# Patient Record
Sex: Female | Born: 1965 | Race: Black or African American | Hispanic: No | Marital: Single | Smoking: Never smoker
Health system: Southern US, Community
[De-identification: ages and names within clinical notes are randomized; demographics above are authoritative.]

## PROBLEM LIST (undated history)

## (undated) DIAGNOSIS — I471 Supraventricular tachycardia: Secondary | ICD-10-CM

## (undated) DIAGNOSIS — I4719 Other supraventricular tachycardia: Secondary | ICD-10-CM

## (undated) DIAGNOSIS — M069 Rheumatoid arthritis, unspecified: Secondary | ICD-10-CM

## (undated) DIAGNOSIS — J45909 Unspecified asthma, uncomplicated: Secondary | ICD-10-CM

## (undated) DIAGNOSIS — T7840XA Allergy, unspecified, initial encounter: Secondary | ICD-10-CM

## (undated) DIAGNOSIS — D219 Benign neoplasm of connective and other soft tissue, unspecified: Secondary | ICD-10-CM

## (undated) HISTORY — DX: Rheumatoid arthritis, unspecified: M06.9

## (undated) HISTORY — DX: Allergy, unspecified, initial encounter: T78.40XA

## (undated) HISTORY — DX: Unspecified asthma, uncomplicated: J45.909

## (undated) HISTORY — PX: SMALL INTESTINE SURGERY: SHX150

---

## 1999-10-17 ENCOUNTER — Encounter: Payer: Self-pay | Admitting: Emergency Medicine

## 1999-10-17 ENCOUNTER — Emergency Department (HOSPITAL_COMMUNITY): Admission: EM | Admit: 1999-10-17 | Discharge: 1999-10-17 | Payer: Self-pay | Admitting: Emergency Medicine

## 2000-10-23 ENCOUNTER — Emergency Department (HOSPITAL_COMMUNITY): Admission: EM | Admit: 2000-10-23 | Discharge: 2000-10-23 | Payer: Self-pay | Admitting: Emergency Medicine

## 2000-10-23 ENCOUNTER — Encounter: Payer: Self-pay | Admitting: Emergency Medicine

## 2001-11-22 ENCOUNTER — Other Ambulatory Visit: Admission: RE | Admit: 2001-11-22 | Discharge: 2001-11-22 | Payer: Self-pay | Admitting: Obstetrics and Gynecology

## 2005-03-08 ENCOUNTER — Emergency Department (HOSPITAL_COMMUNITY): Admission: EM | Admit: 2005-03-08 | Discharge: 2005-03-08 | Payer: Self-pay | Admitting: *Deleted

## 2009-04-22 ENCOUNTER — Emergency Department (HOSPITAL_COMMUNITY): Admission: EM | Admit: 2009-04-22 | Discharge: 2009-04-22 | Payer: Self-pay | Admitting: Emergency Medicine

## 2009-04-22 ENCOUNTER — Emergency Department (HOSPITAL_COMMUNITY): Admission: EM | Admit: 2009-04-22 | Discharge: 2009-04-23 | Payer: Self-pay | Admitting: Emergency Medicine

## 2009-06-06 ENCOUNTER — Encounter (INDEPENDENT_AMBULATORY_CARE_PROVIDER_SITE_OTHER): Payer: Self-pay

## 2009-06-06 ENCOUNTER — Inpatient Hospital Stay (HOSPITAL_COMMUNITY): Admission: EM | Admit: 2009-06-06 | Discharge: 2009-06-13 | Payer: Self-pay | Admitting: Emergency Medicine

## 2010-06-15 LAB — DIFFERENTIAL
Eosinophils Absolute: 0 10*3/uL (ref 0.0–0.7)
Eosinophils Relative: 0 % (ref 0–5)
Monocytes Relative: 13 % — ABNORMAL HIGH (ref 3–12)
Neutro Abs: 4.2 10*3/uL (ref 1.7–7.7)
Neutrophils Relative %: 62 % (ref 43–77)

## 2010-06-15 LAB — POCT URINALYSIS DIP (DEVICE)
Hgb urine dipstick: NEGATIVE
Protein, ur: NEGATIVE mg/dL
Specific Gravity, Urine: 1.025 (ref 1.005–1.030)
Urobilinogen, UA: 0.2 mg/dL (ref 0.0–1.0)
pH: 6 (ref 5.0–8.0)

## 2010-06-15 LAB — POCT I-STAT, CHEM 8
Calcium, Ion: 1.07 mmol/L — ABNORMAL LOW (ref 1.12–1.32)
Creatinine, Ser: 0.6 mg/dL (ref 0.4–1.2)
Glucose, Bld: 117 mg/dL — ABNORMAL HIGH (ref 70–99)
Hemoglobin: 14.3 g/dL (ref 12.0–15.0)

## 2010-06-15 LAB — CBC
HCT: 31.5 % — ABNORMAL LOW (ref 36.0–46.0)
MCV: 80 fL (ref 78.0–100.0)
RBC: 3.94 MIL/uL (ref 3.87–5.11)

## 2010-06-15 LAB — URINE MICROSCOPIC-ADD ON

## 2010-06-15 LAB — URINALYSIS, ROUTINE W REFLEX MICROSCOPIC
Glucose, UA: 100 mg/dL — AB
Leukocytes, UA: NEGATIVE
Specific Gravity, Urine: 1.018 (ref 1.005–1.030)
pH: 8.5 — ABNORMAL HIGH (ref 5.0–8.0)

## 2010-06-15 LAB — BASIC METABOLIC PANEL
CO2: 24 mEq/L (ref 19–32)
Creatinine, Ser: 0.56 mg/dL (ref 0.4–1.2)
GFR calc Af Amer: >60 mL/min (ref >60)
Glucose, Bld: 100 mg/dL — ABNORMAL HIGH (ref 70–99)
Potassium: 3.6 mEq/L (ref 3.5–5.1)

## 2010-06-22 LAB — COMPREHENSIVE METABOLIC PANEL WITH GFR
AST: 28 U/L (ref 0–37)
Albumin: 3.3 g/dL — ABNORMAL LOW (ref 3.5–5.2)
BUN: 7 mg/dL (ref 6–23)
Calcium: 9.4 mg/dL (ref 8.4–10.5)
Creatinine, Ser: 0.59 mg/dL (ref 0.4–1.2)
Potassium: 4.2 meq/L (ref 3.5–5.1)
Total Protein: 8.5 g/dL — ABNORMAL HIGH (ref 6.0–8.3)

## 2010-06-22 LAB — DIFFERENTIAL
Basophils Absolute: 0 10*3/uL (ref 0.0–0.1)
Basophils Absolute: 0.1 K/uL (ref 0.0–0.1)
Basophils Relative: 0 % (ref 0–1)
Basophils Relative: 1 % (ref 0–1)
Eosinophils Absolute: 0 10*3/uL (ref 0.0–0.7)
Eosinophils Absolute: 0 K/uL (ref 0.0–0.7)
Eosinophils Relative: 0 % (ref 0–5)
Eosinophils Relative: 0 % (ref 0–5)
Lymphocytes Relative: 7 % — ABNORMAL LOW (ref 12–46)
Lymphs Abs: 0.5 10*3/uL — ABNORMAL LOW (ref 0.7–4.0)
Lymphs Abs: 0.6 10*3/uL — ABNORMAL LOW (ref 0.7–4.0)
Lymphs Abs: 0.9 10*3/uL (ref 0.7–4.0)
Monocytes Absolute: 0.6 K/uL (ref 0.1–1.0)
Monocytes Relative: 12 % (ref 3–12)
Monocytes Relative: 8 % (ref 3–12)
Neutro Abs: 14.3 10*3/uL — ABNORMAL HIGH (ref 1.7–7.7)
Neutro Abs: 6.7 10*3/uL (ref 1.7–7.7)
Neutrophils Relative %: 84 % — ABNORMAL HIGH (ref 43–77)
Neutrophils Relative %: 89 % — ABNORMAL HIGH (ref 43–77)

## 2010-06-22 LAB — CBC
HCT: 29.5 % — ABNORMAL LOW (ref 36.0–46.0)
HCT: 30 % — ABNORMAL LOW (ref 36.0–46.0)
HCT: 37.8 % (ref 36.0–46.0)
Hemoglobin: 12.3 g/dL (ref 12.0–15.0)
Hemoglobin: 9.9 g/dL — ABNORMAL LOW (ref 12.0–15.0)
MCHC: 32.6 g/dL (ref 30.0–36.0)
MCHC: 32.8 g/dL (ref 30.0–36.0)
MCHC: 33 g/dL (ref 30.0–36.0)
MCV: 79.7 fL (ref 78.0–100.0)
MCV: 80.7 fL (ref 78.0–100.0)
Platelets: 229 10*3/uL (ref 150–400)
Platelets: 286 10*3/uL (ref 150–400)
RBC: 3.66 MIL/uL — ABNORMAL LOW (ref 3.87–5.11)
RBC: 3.7 MIL/uL — ABNORMAL LOW (ref 3.87–5.11)
RBC: 4.74 MIL/uL (ref 3.87–5.11)
RDW: 17.7 % — ABNORMAL HIGH (ref 11.5–15.5)
RDW: 17.8 % — ABNORMAL HIGH (ref 11.5–15.5)
RDW: 18 % — ABNORMAL HIGH (ref 11.5–15.5)
WBC: 7.9 10*3/uL (ref 4.0–10.5)
WBC: 8 K/uL (ref 4.0–10.5)

## 2010-06-22 LAB — URINALYSIS, ROUTINE W REFLEX MICROSCOPIC
Glucose, UA: NEGATIVE mg/dL
Hgb urine dipstick: NEGATIVE
Ketones, ur: >80 mg/dL — AB
Leukocytes, UA: NEGATIVE
Nitrite: NEGATIVE
Protein, ur: 100 mg/dL — AB
Specific Gravity, Urine: 1.038 — ABNORMAL HIGH (ref 1.005–1.030)
Urobilinogen, UA: 1 mg/dL (ref 0.0–1.0)
pH: 6 (ref 5.0–8.0)

## 2010-06-22 LAB — BASIC METABOLIC PANEL
BUN: 2 mg/dL — ABNORMAL LOW (ref 6–23)
CO2: 22 mEq/L (ref 19–32)
CO2: 27 mEq/L (ref 19–32)
Calcium: 8.7 mg/dL (ref 8.4–10.5)
Chloride: 106 mEq/L (ref 96–112)
Chloride: 108 mEq/L (ref 96–112)
GFR calc Af Amer: >60 mL/min (ref >60)
GFR calc Af Amer: >60 mL/min (ref >60)
Glucose, Bld: 143 mg/dL — ABNORMAL HIGH (ref 70–99)
Potassium: 3.8 mEq/L (ref 3.5–5.1)
Sodium: 138 mEq/L (ref 135–145)

## 2010-06-22 LAB — URINE MICROSCOPIC-ADD ON

## 2010-06-22 LAB — COMPREHENSIVE METABOLIC PANEL
ALT: 16 U/L (ref 0–35)
Alkaline Phosphatase: 89 U/L (ref 39–117)
CO2: 19 mEq/L (ref 19–32)
Chloride: 99 mEq/L (ref 96–112)
GFR calc Af Amer: >60 mL/min (ref >60)
GFR calc non Af Amer: >60 mL/min (ref >60)
Glucose, Bld: 114 mg/dL — ABNORMAL HIGH (ref 70–99)
Sodium: 132 mEq/L — ABNORMAL LOW (ref 135–145)
Total Bilirubin: 1.9 mg/dL — ABNORMAL HIGH (ref 0.3–1.2)

## 2010-06-22 LAB — PREGNANCY, URINE: Preg Test, Ur: NEGATIVE

## 2010-06-22 LAB — LIPASE, BLOOD: Lipase: 22 U/L (ref 11–59)

## 2012-04-30 ENCOUNTER — Encounter (HOSPITAL_COMMUNITY): Payer: Self-pay | Admitting: Emergency Medicine

## 2012-04-30 ENCOUNTER — Emergency Department (HOSPITAL_COMMUNITY): Payer: 59

## 2012-04-30 ENCOUNTER — Emergency Department (HOSPITAL_COMMUNITY)
Admission: EM | Admit: 2012-04-30 | Discharge: 2012-04-30 | Disposition: A | Discharge status: Home / Self Care | Payer: 59 | Attending: Emergency Medicine | Admitting: Emergency Medicine

## 2012-04-30 ENCOUNTER — Encounter (HOSPITAL_COMMUNITY): Payer: Self-pay | Admitting: Nurse Practitioner

## 2012-04-30 ENCOUNTER — Emergency Department (HOSPITAL_COMMUNITY)
Admission: EM | Admit: 2012-04-30 | Discharge: 2012-04-30 | Disposition: A | Discharge status: Nursing Home (Includes ICF) | Payer: 59 | Source: Home / Self Care

## 2012-04-30 DIAGNOSIS — R0602 Shortness of breath: Secondary | ICD-10-CM | POA: Insufficient documentation

## 2012-04-30 DIAGNOSIS — R000 Tachycardia, unspecified: Secondary | ICD-10-CM

## 2012-04-30 DIAGNOSIS — R42 Dizziness and giddiness: Secondary | ICD-10-CM | POA: Insufficient documentation

## 2012-04-30 DIAGNOSIS — R002 Palpitations: Secondary | ICD-10-CM | POA: Insufficient documentation

## 2012-04-30 DIAGNOSIS — Z8739 Personal history of other diseases of the musculoskeletal system and connective tissue: Secondary | ICD-10-CM | POA: Insufficient documentation

## 2012-04-30 DIAGNOSIS — J3489 Other specified disorders of nose and nasal sinuses: Secondary | ICD-10-CM | POA: Insufficient documentation

## 2012-04-30 LAB — POCT I-STAT, CHEM 8
BUN: 10 mg/dL (ref 6–23)
Calcium, Ion: 1.26 mmol/L — ABNORMAL HIGH (ref 1.12–1.23)
Chloride: 111 mEq/L (ref 96–112)
Creatinine, Ser: 0.7 mg/dL (ref 0.50–1.10)
Sodium: 140 mEq/L (ref 135–145)
TCO2: 22 mmol/L (ref 0–100)

## 2012-04-30 LAB — CBC
HCT: 31.4 % — ABNORMAL LOW (ref 36.0–46.0)
Hemoglobin: 9.8 g/dL — ABNORMAL LOW (ref 12.0–15.0)
MCH: 22.7 pg — ABNORMAL LOW (ref 26.0–34.0)
MCHC: 31.2 g/dL (ref 30.0–36.0)
RBC: 4.31 MIL/uL (ref 3.87–5.11)

## 2012-04-30 LAB — D-DIMER, QUANTITATIVE: D-Dimer, Quant: 0.33 ug/mL-FEU (ref 0.00–0.48)

## 2012-04-30 MED ORDER — DILTIAZEM HCL 50 MG/10ML IV SOLN
10.0000 mg | Freq: Once | INTRAVENOUS | Status: AC
  Administered 2012-04-30: 10 mg via INTRAVENOUS
  Filled 2012-04-30: qty 2

## 2012-04-30 MED ORDER — ASPIRIN 81 MG PO CHEW
324.0000 mg | CHEWABLE_TABLET | Freq: Once | ORAL | Status: AC
  Administered 2012-04-30: 324 mg via ORAL
  Filled 2012-04-30: qty 4

## 2012-04-30 MED ORDER — SODIUM CHLORIDE 0.9 % IV SOLN
Freq: Once | INTRAVENOUS | Status: AC
  Administered 2012-04-30: 14:00:00 via INTRAVENOUS

## 2012-04-30 NOTE — ED Provider Notes (Signed)
History     CSN: [id number]  Arrival date & time 04/30/12  1442   First MD Initiated Contact with Patient 04/30/12 1508      Chief Complaint  Patient presents with  . Tachycardia    (Consider location/radiation/quality/duration/timing/severity/associated sxs/prior treatment) The history is provided by the patient and medical records.    Alicia Fleming is a 47 y.o. female  with a hx of rheumatoid arthritis for which she has taken prednisone in the past but not within the last 6 months, presents to the Emergency Department complaining of gradual, persistent, progressively worsening tachycardia onset 11 AM. Patient states she got up this morning and noticed her heart was racing. She laid back down and her heart slowed but each time she subsequently stood up her heart would race again. She had associated shortness of breath, mild lightheadedness when standing and a "strange feeling in her chest."  Nothing makes it better and standing makes it worse.  Pt denies fever, chills, headache, neck pain, chest pain abdominal pain, nausea, vomiting, diarrhea, weakness, dizziness, syncope, dysuria, hematuria.  Patient is a nonsmoker, denies illicit drug use he endorses occasional social drinking.  Patient denies taking this estrogen, recent surgery, long car trips or immobilization.  Patient states she had one mixed drink last night with a mild headache this morning which was alleviated by Tylenol Extra Strength.  Patient also states there has been some significant family stress and she feels mildly anxious but has never had official panic attacks.  She's had mild rhinorrhea for the past week but she has not taken any decongestants or cold medications.   Past Medical History  Diagnosis Date  . Arthritis     History reviewed. No pertinent past surgical history.  History reviewed. No pertinent family history.  History  Substance Use Topics  . Smoking status: Never Smoker   . Smokeless tobacco: Not on  file  . Alcohol Use: No    OB History    Grav Para Term Preterm Abortions TAB SAB Ect Mult Living                  Review of Systems  Constitutional: Negative for fever, diaphoresis, appetite change, fatigue and unexpected weight change.  HENT: Positive for rhinorrhea. Negative for mouth sores, neck pain and neck stiffness.   Eyes: Negative for visual disturbance.  Respiratory: Negative for cough, chest tightness, shortness of breath and wheezing.   Cardiovascular: Positive for palpitations. Negative for chest pain and leg swelling.  Gastrointestinal: Negative for nausea, vomiting, abdominal pain, diarrhea and constipation.  Genitourinary: Negative for dysuria, urgency, frequency and hematuria.  Musculoskeletal: Negative for back pain.  Skin: Negative for rash.  Neurological: Negative for syncope, light-headedness and headaches.  Hematological: Does not bruise/bleed easily.  Psychiatric/Behavioral: Negative for sleep disturbance. The patient is not nervous/anxious.   All other systems reviewed and are negative.    Allergies  Review of patient's allergies indicates no known allergies.  Home Medications   Current Outpatient Rx  Name  Route  Sig  Dispense  Refill  . ACETAMINOPHEN 500 MG PO TABS   Oral   Take 500 mg by mouth every 6 (six) hours as needed. For pain         . CETIRIZINE HCL 10 MG PO TABS   Oral   Take 10 mg by mouth daily.           BP 98/68  Pulse 111  Temp 98.3 F (36.8 C) (Oral)  Resp 15  Ht 5\' 7"  (1.702 m)  Wt 152 lb (68.947 kg)  BMI 23.81 kg/m2  SpO2 100%  LMP 04/03/2012  Physical Exam  Nursing note and vitals reviewed. Constitutional: She is oriented to person, place, and time. She appears well-developed and well-nourished. No distress. She is not intubated.  HENT:  Head: Normocephalic and atraumatic.  Mouth/Throat: Uvula is midline, oropharynx is clear and moist and mucous membranes are normal. No oropharyngeal exudate, posterior  oropharyngeal edema, posterior oropharyngeal erythema or tonsillar abscesses.       No exophthalmos noted   Eyes: Conjunctivae normal and EOM are normal. Pupils are equal, round, and reactive to light. No scleral icterus.  Neck: Normal range of motion, full passive range of motion without pain and phonation normal. Neck supple. No mass and no thyromegaly present.  Cardiovascular: Regular rhythm, S1 normal, S2 normal, normal heart sounds and intact distal pulses.  Tachycardia present.  PMI is not displaced.   No murmur heard. Pulses:      Radial pulses are 2+ on the right side, and 2+ on the left side.       Dorsalis pedis pulses are 2+ on the right side, and 2+ on the left side.       Posterior tibial pulses are 2+ on the right side, and 2+ on the left side.  Pulmonary/Chest: Effort normal and breath sounds normal. No accessory muscle usage. Not tachypneic. She is not intubated. No respiratory distress. She has no decreased breath sounds. She has no wheezes. She has no rhonchi. She has no rales.  Abdominal: Soft. Normal appearance and bowel sounds are normal. She exhibits no mass. There is no tenderness. There is no rigidity, no rebound, no guarding, no CVA tenderness, no tenderness at McBurney's point and negative Murphy's sign.  Musculoskeletal: Normal range of motion. She exhibits no edema and no tenderness.       No peripheral edema; no pretibial myxodema   Lymphadenopathy:    She has no cervical adenopathy.  Neurological: She is alert and oriented to person, place, and time. She has normal reflexes. She exhibits normal muscle tone. Coordination normal.       Speech is clear and goal oriented Moves extremities without ataxia  Skin: Skin is warm and dry. No rash noted. She is not diaphoretic. No erythema.  Psychiatric: She has a normal mood and affect.    ED Course  Procedures (including critical care time)  Labs Reviewed  PRO B NATRIURETIC PEPTIDE - Abnormal; Notable for the  following:    Pro B Natriuretic peptide (BNP) 165.2 (*)     All other components within normal limits  CBC - Abnormal; Notable for the following:    Hemoglobin 9.8 (*)     HCT 31.4 (*)     MCV 72.9 (*)     MCH 22.7 (*)     RDW 17.2 (*)     All other components within normal limits  POCT I-STAT, CHEM 8 - Abnormal; Notable for the following:    Calcium, Ion 1.26 (*)     Hemoglobin 11.2 (*)     HCT 33.0 (*)     All other components within normal limits  D-DIMER, QUANTITATIVE   Dg Chest 2 View  04/30/2012  *RADIOLOGY REPORT*  Clinical Data: Tachycardia  CHEST - 2 VIEW  Comparison: None.  Findings:  Normal cardiac silhouette and mediastinal contours.  No focal parenchymal opacities. No definite evidence of pulmonary edema.  No pleural effusion or pneumothorax.  Mild scoliotic  curvature of the thoracolumbar spine, possibly positional.  IMPRESSION: No acute cardiopulmonary disease.   Original Report Authenticated By: Tacey Ruiz, MD     ECG:  Date: 04/30/2012  Rate: 142  Rhythm: junctional tachycardia  QRS Axis: normal  Intervals: n/a  ST/T Wave abnormalities: nonspecific T wave changes  Conduction Disutrbances:none  Narrative Interpretation: no p waves present, inverted T waves II, III, V3-V6  Old EKG Reviewed: none available    1. Tachycardia       MDM  Alicia Fleming presents for tachycardia.  SBP in the low 90's with resting HR in the 130's.  Concern for PE, anemia, thyroid conditions, hypovolemia and contributing anxiety.  Labs and imaging ordered.  Pt receiving fluid bolus and Cardizem 10mg  IV ordered.  Wells PE criteria is moderate, and pt is not PERC negative.    Mildly elevated BNP at 165, d-dimer negative, patient mildly anemic at 9.8 but this appears to be her baseline, i-STAT Chem-8 without slight abnormalities.  Pt BP has remained steady throughout time in the department.  She given Cardizem 10 mg IV with conversion to normal sinus rhythm at less than 100. Confirmed by  ECG.   Date: 04/30/2012  Rate: 92  Rhythm: normal sinus rhythm  QRS Axis: normal  Intervals: normal  ST/T Wave abnormalities: normal  Conduction Disutrbances:none  Narrative Interpretation: Normal sinus rhythm, nonischemic ECG  Old EKG Reviewed: changes noted  Patient with resolution of palpitations and shortness of breath after rhythm conversion. Likely episode of SVT vs POTS.  Dr. Rennis Chris discussed with [facility] cardiology who recommends DC home without medication and close followup next week.  I discussed these findings with the patient and her plan of action.  I have also discussed reasons to return immediately to the ER.  Patient expresses understanding and agrees with plan.  1. Medications: usual home medications 2. Treatment: rest, drink plenty of fluids,  3. Follow Up: Please followup with your primary doctor for discussion of your diagnoses and further evaluation after today's visit; followup with [facility] cardiology, call tomorrow morning to make an appointment for next week         [facility], PA-C 04/30/12 1741

## 2012-04-30 NOTE — ED Notes (Signed)
Pt states that for the past hour and a half her heart has been racing. She has laid down and rested and it slowed down but upon standing heart starts to race again. Pt is having slight shortness of breath. Pt denies chest pain and complaining of a slight head ache.

## 2012-04-30 NOTE — ED Notes (Signed)
Patient is not experiencing any lightheadedness or dizziness after doing the orthostatics.

## 2012-04-30 NOTE — ED Provider Notes (Deleted)
History     CSN: [id number]  Arrival date & time 04/30/12  1337   None     Chief Complaint  Patient presents with  . Tachycardia    heart racing x hour and half. pt tried rest. which helped but upon standing heart races again.     (Consider location/radiation/quality/duration/timing/severity/associated sxs/prior treatment) Patient is a 47 y.o. female presenting with palpitations. The history is provided by the patient.  Palpitations  This is a new problem. The problem occurs constantly. The problem has not changed since onset.The problem is associated with anxiety. Associated symptoms include shortness of breath. She has tried nothing for the symptoms. The treatment provided moderate relief.    Past Medical History  Diagnosis Date  . Arthritis     History reviewed. No pertinent past surgical history.  History reviewed. No pertinent family history.  History  Substance Use Topics  . Smoking status: Never Smoker   . Smokeless tobacco: Not on file  . Alcohol Use: No    OB History    Grav Para Term Preterm Abortions TAB SAB Ect Mult Living                  Review of Systems  Respiratory: Positive for shortness of breath.   Cardiovascular: Positive for palpitations.  All other systems reviewed and are negative.    Allergies  Review of patient's allergies indicates not on file.  Home Medications  No current outpatient prescriptions on file.  BP 103/68  Pulse 150  Temp 97.9 F (36.6 C) (Oral)  Resp 18  SpO2 100%  LMP 04/10/2012  Physical Exam  Nursing note and vitals reviewed. Constitutional: She is oriented to person, place, and time. She appears well-developed and well-nourished.  HENT:  Head: Normocephalic and atraumatic.  Right Ear: External ear normal.  Left Ear: External ear normal.  Eyes: Conjunctivae normal and EOM are normal. Pupils are equal, round, and reactive to light.  Neck: Normal range of motion. Neck supple.  Cardiovascular: Normal  rate.   Pulmonary/Chest: Effort normal and breath sounds normal.  Abdominal: Soft.  Musculoskeletal: Normal range of motion.  Neurological: She is alert and oriented to person, place, and time. She has normal reflexes.  Skin: Skin is warm.  Psychiatric: She has a normal mood and affect.    ED Course  Procedures (including critical care time)  Labs Reviewed - No data to display No results found.   1. Tachycardia       MDM   Date: 04/30/2012  Rate: 136  Rhythm: sinus tachycardia  QRS Axis: normal  Intervals: normal  ST/T Wave abnormalities: normal  Conduction Disutrbances:p wave abnormality  Narrative Interpretation:   Old EKG Reviewed: none available         ELESHIA WOOLEY, [place] 04/30/12 1431

## 2012-04-30 NOTE — ED Notes (Signed)
Sinus tach noted per monitor with rate 140's

## 2012-04-30 NOTE — ED Provider Notes (Signed)
History     CSN: [id number]  Arrival date & time 04/30/12  1337   None     Chief Complaint  Patient presents with  . Tachycardia    heart racing x hour and half. pt tried rest. which helped but upon standing heart races again.     (Consider location/radiation/quality/duration/timing/severity/associated sxs/prior treatment) Patient is a 47 y.o. female presenting with palpitations. The history is provided by the patient. No language interpreter was used.  Palpitations  This is a new problem. The current episode started 1 to 2 hours ago. The problem occurs constantly. The problem has been gradually worsening. The problem is associated with anxiety. On average, each episode lasts 2 hours. Associated symptoms include shortness of breath. Associated symptoms comments: Short of breath. She has tried nothing for the symptoms. The treatment provided no relief.  Pt complains of feeling short of breath and her heart racing.   Pt reports worse with exertion,  Decreased shortness of breath with exertion  Past Medical History  Diagnosis Date  . Arthritis     History reviewed. No pertinent past surgical history.  History reviewed. No pertinent family history.  History  Substance Use Topics  . Smoking status: Never Smoker   . Smokeless tobacco: Not on file  . Alcohol Use: No    OB History    Grav Para Term Preterm Abortions TAB SAB Ect Mult Living                  Review of Systems  Respiratory: Positive for shortness of breath.   Cardiovascular: Positive for palpitations.  All other systems reviewed and are negative.    Allergies  Review of patient's allergies indicates not on file.  Home Medications  No current outpatient prescriptions on file.  BP 107/68  Pulse 138  Temp 97.9 F (36.6 C) (Oral)  Resp 26  SpO2 100%  LMP 04/10/2012  Physical Exam  Nursing note and vitals reviewed. Constitutional: She appears well-developed and well-nourished.  HENT:  Head:  Normocephalic.  Right Ear: External ear normal.  Mouth/Throat: Oropharynx is clear and moist.  Eyes: Pupils are equal, round, and reactive to light.  Neck: Normal range of motion.  Cardiovascular: Regular rhythm.        Tachycardic 140's  Pulmonary/Chest: Effort normal and breath sounds normal.  Abdominal: Soft.  Musculoskeletal: Normal range of motion.  Neurological: She is alert.  Skin: Skin is warm.    ED Course  Procedures (including critical care time)  Labs Reviewed - No data to display No results found.   1. Tachycardia       MDM   Date: 04/30/2012  Rate: 136  Rhythm: sinus tachycardia  QRS Axis: normal  Intervals: normal  ST/T Wave abnormalities: normal  Conduction Disutrbances:p wave abnormality  Narrative Interpretation:   Old EKG Reviewed: none available    Ekg reviewed with Dr. Ladon Applebaum.   Pt placed on a monitor,  To ED for evaluation of shortness of breath and tachycardia.    Ciana Simmon [place], [place] 04/30/12 1513

## 2012-04-30 NOTE — ED Notes (Signed)
Per EMS pt comes from [facility] for palpitations and tachycardia. HR sitting 130-140, standing 150-160, B/P goes down when changing positions. 22 G rt AC. Vitals 106/73, 150 HR  No hx of heart problems, and no medications.

## 2012-04-30 NOTE — ED Provider Notes (Addendum)
Presents with feeling of rapid heartbeat and mild shortness of breath onset this morning. No chest pain no other complaint no treatment prior to coming here on exam pleasant cooperative alert Glasgow Coma Score 15 lungs clear auscultation heart tachycardic regular rhythm. 4:40 PM patient feels improved after treatment with intravenous Cardizem she is in sinus rhythm approximately 90 beats per minute. She's not lightheaded on standing Spoke with Dr.Aitsebaomo, cardiologist plan no further treatment or prescription as needed. Patient to call your cardiology tomorrow schedule an office visit for this week.  Doug Sou, MD 04/30/12 1717  Doug Sou, MD 05/01/12 1610

## 2012-04-30 NOTE — ED Provider Notes (Signed)
Patient is hemodynamically stable, EKG suggestive of an intra-nodal/auricular block- patient will be transferred to the emergency department, under monitored conditions- no pharmacological intervention- performed at [facility]. For evaluation and treatment of a cardiac arrhythmia Medical screening examination/treatment/procedure(s) were performed by non-physician practitioner and as supervising physician I was immediately available for consultation/collaboration.  Raynald Blend, MD 04/30/12 [phone]

## 2012-04-30 NOTE — ED Notes (Signed)
Patient states that the tachycardia began at 100 when she went to the bathroom.  States that her heart rate would speed up when she stood up and slowed when she lay down.  Denies any chest pain.

## 2012-04-30 NOTE — ED Notes (Signed)
Pt placed on cardiac monitor (145bpm on lead II) and 3L O2 by Bertram.

## 2012-05-01 NOTE — ED Provider Notes (Signed)
Medical screening examination/treatment/procedure(s) were conducted as a shared visit with non-physician practitioner(s) and myself.  I personally evaluated the patient during the encounter  Doug Sou, MD 05/01/12 [phone]

## 2012-05-17 ENCOUNTER — Encounter: Payer: Self-pay | Admitting: Internal Medicine

## 2012-05-17 ENCOUNTER — Institutional Professional Consult (permissible substitution): Payer: 59 | Admitting: Internal Medicine

## 2012-05-29 ENCOUNTER — Ambulatory Visit: Payer: 59 | Admitting: Internal Medicine

## 2012-06-01 ENCOUNTER — Ambulatory Visit (INDEPENDENT_AMBULATORY_CARE_PROVIDER_SITE_OTHER): Payer: 59 | Admitting: Internal Medicine

## 2012-06-01 ENCOUNTER — Encounter: Payer: Self-pay | Admitting: *Deleted

## 2012-06-01 ENCOUNTER — Encounter: Payer: Self-pay | Admitting: Internal Medicine

## 2012-06-01 ENCOUNTER — Other Ambulatory Visit: Payer: Self-pay | Admitting: *Deleted

## 2012-06-01 VITALS — BP 102/64 | HR 84 | Ht 67.0 in | Wt 164.2 lb

## 2012-06-01 NOTE — Patient Instructions (Addendum)
Please follow instruction sheet given  Your physician recommends that you return for lab work in: 06/23/12 (BMP, CBC. HCG)

## 2012-06-02 ENCOUNTER — Encounter: Payer: Self-pay | Admitting: Internal Medicine

## 2012-06-02 NOTE — Assessment & Plan Note (Signed)
I've discussed the treatment options with the patient. She has recurrent SVT despite medical therapy. She has requested to undergo catheter ablation. The risk, goals, benefits, and expectations of the procedure have been discussed with the patient, and she wishes to proceed. This will be scheduled in the next several weeks.

## 2012-06-02 NOTE — Progress Notes (Signed)
HPI Ms. Alicia Fleming is referred today by Dr. Jacinto Halim for evaluation of SVT. The patient has a history of palpitations dating back many years. Over the last several months, they have increased in frequency and severity. She has had several episodes which last for over an hour. Subsequent evaluation has demonstrated a short RP tachycardia at a rate of 140 beats per minute. The patient states that these episodes start suddenly and are associated with shortness of breath. They typically are sustained. She has not tried vagal maneuvers. She was given a prescription for short acting diltiazem with minimal improvement. She has not had syncope. No Known Allergies   Current Outpatient Prescriptions  Medication Sig Dispense Refill  . acetaminophen (TYLENOL) 500 MG tablet Take 500 mg by mouth every 6 (six) hours as needed. For pain      . cetirizine (ZYRTEC) 10 MG tablet Take 10 mg by mouth daily.      Marland Kitchen diltiazem (CARDIZEM) 30 MG tablet Take 30 mg by mouth as needed.       No current facility-administered medications for this visit.     Past Medical History  Diagnosis Date  . Arthritis   . Rheumatoid arthritis   . Other specified cardiac dysrhythmias     ROS:   All systems reviewed and negative except as noted in the HPI.   History reviewed. No pertinent past surgical history.   Family History  Problem Relation Age of Onset  . Hypertension Mother   . Hypertension Father   . Stroke Maternal Grandmother   . Stroke Maternal Grandfather   . Heart disease Paternal Grandfather      History   Social History  . Marital Status: Single    Spouse Name: N/A    Number of Children: N/A  . Years of Education: N/A   Occupational History  . Not on file.   Social History Main Topics  . Smoking status: Never Smoker   . Smokeless tobacco: Not on file  . Alcohol Use: No  . Drug Use: No  . Sexually Active: Yes    Birth Control/ Protection: Condom   Other Topics Concern  . Not on file   Social  History Narrative  . No narrative on file     BP 102/64  Pulse 84  Ht 5\' 7"  (1.702 m)  Wt 164 lb 3.2 oz (74.481 kg)  BMI 25.71 kg/m2  Physical Exam:  Well appearing middle-aged woman,NAD HEENT: Unremarkable Neck:  6 cm JVD, no thyromegally Lungs:  Clear with no wheezes, rales, or rhonchi. HEART:  Regular rate rhythm, no murmurs, no rubs, no clicks Abd:  soft, positive bowel sounds, no organomegally, no rebound, no guarding Ext:  2 plus pulses, no edema, no cyanosis, no clubbing Skin:  No rashes no nodules Neuro:  CN II through XII intact, motor grossly intact  EKG - normal sinus rhythm with no ventricular preexcitation  Assess/Plan:

## 2012-06-16 ENCOUNTER — Encounter (HOSPITAL_COMMUNITY): Payer: Self-pay | Admitting: Respiratory Therapy

## 2012-06-23 ENCOUNTER — Other Ambulatory Visit (INDEPENDENT_AMBULATORY_CARE_PROVIDER_SITE_OTHER): Payer: 59

## 2012-06-23 DIAGNOSIS — I471 Supraventricular tachycardia: Secondary | ICD-10-CM

## 2012-06-23 DIAGNOSIS — I498 Other specified cardiac arrhythmias: Secondary | ICD-10-CM

## 2012-06-23 LAB — CBC WITH DIFFERENTIAL/PLATELET
Basophils Absolute: 0 10*3/uL (ref 0.0–0.1)
Hemoglobin: 9.4 g/dL — ABNORMAL LOW (ref 12.0–15.0)
Lymphocytes Relative: 32.8 % (ref 12.0–46.0)
Monocytes Relative: 9.3 % (ref 3.0–12.0)
Neutrophils Relative %: 50.6 % (ref 43.0–77.0)
Platelets: 209 10*3/uL (ref 150.0–400.0)
RDW: 19.2 % — ABNORMAL HIGH (ref 11.5–14.6)

## 2012-06-23 LAB — BASIC METABOLIC PANEL
BUN: 9 mg/dL (ref 6–23)
Calcium: 9.3 mg/dL (ref 8.4–10.5)
Creatinine, Ser: 0.6 mg/dL (ref 0.4–1.2)
GFR: 154.02 mL/min (ref >60.00)
Glucose, Bld: 81 mg/dL (ref 70–99)
Sodium: 134 mEq/L — ABNORMAL LOW (ref 135–145)

## 2012-06-24 LAB — HCG, SERUM, QUALITATIVE: Preg, Serum: NEGATIVE

## 2012-06-30 ENCOUNTER — Ambulatory Visit (HOSPITAL_COMMUNITY)
Admission: RE | Admit: 2012-06-30 | Discharge: 2012-06-30 | Disposition: A | Discharge status: Home / Self Care | Payer: 59 | Source: Ambulatory Visit | Attending: Internal Medicine | Admitting: Internal Medicine

## 2012-06-30 ENCOUNTER — Encounter (HOSPITAL_COMMUNITY)
Admission: RE | Disposition: A | Discharge status: Home / Self Care | Payer: Self-pay | Source: Ambulatory Visit | Attending: Internal Medicine

## 2012-06-30 ENCOUNTER — Encounter (HOSPITAL_COMMUNITY): Payer: Self-pay | Admitting: *Deleted

## 2012-06-30 DIAGNOSIS — M069 Rheumatoid arthritis, unspecified: Secondary | ICD-10-CM | POA: Insufficient documentation

## 2012-06-30 DIAGNOSIS — I471 Supraventricular tachycardia: Secondary | ICD-10-CM

## 2012-06-30 DIAGNOSIS — I498 Other specified cardiac arrhythmias: Secondary | ICD-10-CM | POA: Insufficient documentation

## 2012-06-30 HISTORY — PX: SUPRAVENTRICULAR TACHYCARDIA ABLATION: SHX5492

## 2012-06-30 LAB — CBC
Platelets: 230 10*3/uL (ref 150–400)
RBC: 4.13 MIL/uL (ref 3.87–5.11)
RDW: 18.6 % — ABNORMAL HIGH (ref 11.5–15.5)
WBC: 5 10*3/uL (ref 4.0–10.5)

## 2012-06-30 LAB — CREATININE, SERUM: GFR calc Af Amer: >90 mL/min (ref >90)

## 2012-06-30 SURGERY — SUPRAVENTRICULAR TACHYCARDIA ABLATION
Anesthesia: LOCAL

## 2012-06-30 MED ORDER — SODIUM CHLORIDE 0.9 % IJ SOLN: 3.0000 mL | Freq: Two times a day (BID) | INTRAMUSCULAR | Status: DC

## 2012-06-30 MED ORDER — FENTANYL CITRATE 0.05 MG/ML IJ SOLN
INTRAMUSCULAR | Status: AC
  Filled 2012-06-30: qty 2

## 2012-06-30 MED ORDER — ONDANSETRON HCL 4 MG/2ML IJ SOLN: 4.0000 mg | Freq: Four times a day (QID) | INTRAMUSCULAR | Status: DC | PRN

## 2012-06-30 MED ORDER — SODIUM CHLORIDE 0.9 % IV SOLN: 250.0000 mL | INTRAVENOUS | Status: DC | PRN

## 2012-06-30 MED ORDER — HEPARIN SODIUM (PORCINE) 5000 UNIT/ML IJ SOLN
5000.0000 [IU] | Freq: Three times a day (TID) | INTRAMUSCULAR | Status: DC
  Filled 2012-06-30 (×3): qty 1

## 2012-06-30 MED ORDER — ACETAMINOPHEN 325 MG PO TABS: 650.0000 mg | ORAL_TABLET | ORAL | Status: DC | PRN

## 2012-06-30 MED ORDER — MIDAZOLAM HCL 5 MG/5ML IJ SOLN
INTRAMUSCULAR | Status: AC
  Filled 2012-06-30: qty 5

## 2012-06-30 MED ORDER — HEPARIN (PORCINE) IN NACL 2-0.9 UNIT/ML-% IJ SOLN
INTRAMUSCULAR | Status: AC
  Filled 2012-06-30: qty 500

## 2012-06-30 MED ORDER — BUPIVACAINE HCL (PF) 0.25 % IJ SOLN
INTRAMUSCULAR | Status: AC
  Filled 2012-06-30: qty 60

## 2012-06-30 MED ORDER — SODIUM CHLORIDE 0.9 % IJ SOLN: 3.0000 mL | INTRAMUSCULAR | Status: DC | PRN

## 2012-06-30 NOTE — Op Note (Signed)
Alicia Fleming, Alicia Fleming               ACCOUNT NO.:  [id number]  MEDICAL RECORD NO.:  [id number]  LOCATION:  3W03C                        FACILITY:  [facility]  PHYSICIAN:  Doylene Canning. 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  INTRODUCTION:  The patient is a 47 year old woman with a history of recurrent tachy palpitations and documented SVT despite medical therapy. She is now referred for electrophysiologic study and catheter ablation.  DESCRIPTION OF PROCEDURE:  After informed consent was obtained, the patient was taken to the diagnostic EP lab in a fasting state.  After usual preparation and draping, a 6-French Hexapolar catheter was inserted percutaneously into the right jugular vein and advanced to the coronary sinus.  A 6-French quadripolar catheter was inserted percutaneously in the right femoral vein and advanced to the right ventricle.  A 6-French quadripolar catheter was inserted percutaneously in the right femoral vein and advanced to His bundle region.  After measurement of basic intervals, rapid ventricular pacing was carried out demonstrating SVT at a pacing cycle length of 500 milliseconds.  During SVT, PVCs replaced at the time of His bundle refractoriness, which did not pre-excite the atrium.  During SVT, ventricular pacing demonstrated a VAV activation sequence.  With the above findings, a diagnosis of AV node reentrant tachycardia was made.  The ablation catheter was then inserted percutaneously into the right femoral vein and advanced into the right atrium.  Mapping of Koch's triangle was carried out, which demonstrated normal size and orientation.  A total of 3 RF energy applications were delivered to sites 9 in 10 and Koch's triangle. During RF energy  application, there was accelerated junctional rhythm. Following RF energy application, rapid ventricular pacing was carried out in the right ventricle and stepwise decreased down to 340 milliseconds where VA Wenckebach was observed.  During rapid ventricular pacing following ablation, there was no SVT.  The atrial activation was midline and decremental.  Programmed ventricular stimulation was then carried out from the right ventricle at the base drive cycle length of 098 milliseconds.  The S1-S2 interval was stepwise decreased down to 260 milliseconds where ventricular refractoriness was observed.  During programmed ventricular stimulation, the atrial activation sequence remained midline and decremental.  Next, programmed atrial stimulation was carried out from the atrium at base drive cycle length of 119 milliseconds.  The S1-S2 interval was stepwise decreased down to 360 milliseconds where the AV node ERP was observed.  During programmed atrial stimulation, following ablation, there were no AH jumps or echo beats remaining.  Finally, rapid atrial pacing was carried out from the atrium at a paced cycle length of 600 milliseconds, stepwise decreased down to 400 milliseconds where AV Wenckebach was observed.  There were no inducible SVT following catheter ablation, with rapid atrial pacing, and the PR interval was less than the RR interval.  The patient was observed for 30 minutes and had, during this time, additional rapid atrial and ventricular pacing and programmed atrial and ventricular stimulation were  carried out demonstrating no atrial arrhythmias or inducible tachycardia.  The catheter was then removed.  Hemostasis was assured, and the patient was returned to her room in satisfactory condition.  COMPLICATIONS:  There were no immediate procedure complications.  RESULTS: 1. Baseline ECG.  Baseline ECG demonstrates normal sinus rhythm with     normal axis and intervals.  There was  no ventricular pre-     excitation. 2. Baseline intervals.  Sinus node cycle length was 866 milliseconds.     The QRS duration was 77 milliseconds.  The HV interval was 46     milliseconds.  The AH interval was 97 milliseconds. 3. Rapid ventricular pacing.  Rapid atrial pacing was carried out from     the right ventricle.  Initially, there was inducible SVT with rapid     ventricular pacing.  Following ablation, rapid ventricular pacing     was carried out and stepwise decreased down to 340 milliseconds     where VA Wenckebach was observed.  During rapid ventricular pacing,     the atrial activation sequence was midline and decremental. 4. Programmed ventricular stimulation.  Programmed ventricular     stimulation was carried out at a base drive cycle length of 119     milliseconds.  The S1-S2 interval was stepwise decreased down to     260 milliseconds where ventricular refractoriness was observed.     During programmed ventricular stimulation, the atrial activation     sequence was midline decremental.  Following ablation, there was no     inducible SVT with programmed ventricular stimulation. 5. Rapid atrial pacing.  Following ablation, rapid atrial pacing was     carried out from the atrium at a base drive cycle length of 147     milliseconds and stepwise decreased down to 400 milliseconds where     AV Wenckebach was observed.  During rapid atrial pacing, the PR     interval was less than the RR interval, and there was no inducible     SVT. 6. Programmed atrial stimulation.  Programmed atrial stimulation was     carried out from the atrium at base drive cycle of 829     milliseconds.  This was 2 interval, stepwise decreased down to 360     milliseconds with the AV node ERP was observed.  During programmed     atrial stimulation, there were no AH jumps, no echo beats following     catheter ablation. 7. Arrhythmias observed. 8. AV node reentrant tachycardia initiation was with rapid  ventricular     pacing.  The duration was sustained.  Cycle length was 520     milliseconds.  Method of termination was with rapid atrial pacing. 9. Mapping.  Mapping of Koch's triangle demonstrated normal size and     orientation. 10.RF energy application.  A total of 3 RF energy applications were     delivered to sites 9 in 10 and Koch's triangle resulted in     accelerated junctional rhythm.  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  This rendered the tachycardia noninducible and no residual slow pathway conduction.     Doylene Canning. [name], MD     GWT/MEDQ  D:  06/30/2012  T:  06/30/2012  Job:  [id number]

## 2012-06-30 NOTE — Interval H&P Note (Signed)
History and Physical Interval Note:  06/30/2012 7:28 AM  Alicia Fleming  has presented today for surgery, with the diagnosis of svt  The various methods of treatment have been discussed with the patient and family. After consideration of risks, benefits and other options for treatment, the patient has consented to  Procedure(s): SUPRAVENTRICULAR TACHYCARDIA ABLATION (N/A) as a surgical intervention .  The patient's history has been reviewed, patient examined, no change in status, stable for surgery.  I have reviewed the patient's chart and labs.  Questions were answered to the patient's satisfaction.     Leonia Reeves.D.

## 2012-06-30 NOTE — Op Note (Signed)
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 [id number].

## 2012-06-30 NOTE — H&P (View-Only) (Signed)
HPI Ms. Cape is referred today by Dr. Jacinto Halim for evaluation of SVT. The patient has a history of palpitations dating back many years. Over the last several months, they have increased in frequency and severity. She has had several episodes which last for over an hour. Subsequent evaluation has demonstrated a short RP tachycardia at a rate of 140 beats per minute. The patient states that these episodes start suddenly and are associated with shortness of breath. They typically are sustained. She has not tried vagal maneuvers. She was given a prescription for short acting diltiazem with minimal improvement. She has not had syncope. No Known Allergies   Current Outpatient Prescriptions  Medication Sig Dispense Refill  . acetaminophen (TYLENOL) 500 MG tablet Take 500 mg by mouth every 6 (six) hours as needed. For pain      . cetirizine (ZYRTEC) 10 MG tablet Take 10 mg by mouth daily.      Marland Kitchen diltiazem (CARDIZEM) 30 MG tablet Take 30 mg by mouth as needed.       No current facility-administered medications for this visit.     Past Medical History  Diagnosis Date  . Arthritis   . Rheumatoid arthritis   . Other specified cardiac dysrhythmias     ROS:   All systems reviewed and negative except as noted in the HPI.   History reviewed. No pertinent past surgical history.   Family History  Problem Relation Age of Onset  . Hypertension Mother   . Hypertension Father   . Stroke Maternal Grandmother   . Stroke Maternal Grandfather   . Heart disease Paternal Grandfather      History   Social History  . Marital Status: Single    Spouse Name: N/A    Number of Children: N/A  . Years of Education: N/A   Occupational History  . Not on file.   Social History Main Topics  . Smoking status: Never Smoker   . Smokeless tobacco: Not on file  . Alcohol Use: No  . Drug Use: No  . Sexually Active: Yes    Birth Control/ Protection: Condom   Other Topics Concern  . Not on file   Social  History Narrative  . No narrative on file     BP 102/64  Pulse 84  Ht 5\' 7"  (1.702 m)  Wt 164 lb 3.2 oz (74.481 kg)  BMI 25.71 kg/m2  Physical Exam:  Well appearing middle-aged woman,NAD HEENT: Unremarkable Neck:  6 cm JVD, no thyromegally Lungs:  Clear with no wheezes, rales, or rhonchi. HEART:  Regular rate rhythm, no murmurs, no rubs, no clicks Abd:  soft, positive bowel sounds, no organomegally, no rebound, no guarding Ext:  2 plus pulses, no edema, no cyanosis, no clubbing Skin:  No rashes no nodules Neuro:  CN II through XII intact, motor grossly intact  EKG - normal sinus rhythm with no ventricular preexcitation  Assess/Plan:

## 2012-06-30 NOTE — Progress Notes (Signed)
Groin OK without complication. Tele without events. OK to DC home.  Maylynn Orzechowski PA-C

## 2012-09-19 ENCOUNTER — Encounter: Payer: 59 | Admitting: Internal Medicine

## 2012-09-22 ENCOUNTER — Encounter: Payer: Self-pay | Admitting: Internal Medicine

## 2013-11-14 ENCOUNTER — Ambulatory Visit (INDEPENDENT_AMBULATORY_CARE_PROVIDER_SITE_OTHER): Payer: 59 | Admitting: Family Medicine

## 2013-11-14 VITALS — BP 112/70 | HR 94 | Temp 98.0°F | Resp 18 | Ht 67.5 in | Wt 170.0 lb

## 2013-11-14 DIAGNOSIS — H6120 Impacted cerumen, unspecified ear: Secondary | ICD-10-CM

## 2013-11-14 DIAGNOSIS — H6121 Impacted cerumen, right ear: Secondary | ICD-10-CM

## 2013-11-14 NOTE — Progress Notes (Signed)
   Subjective:  This chart was scribed for Robyn Haber, MD by Randa Evens, ED Scribe. This Patient was seen in room 03 and the patients care was started at 8:45 PM   Patient ID: Alicia Fleming, female    DOB: 1966/01/26, 48 y.o.   MRN: [id number]  Chief Complaint  Patient presents with  . Cerumen Impaction    right ear after cleaning ear   . sinus drainage    HPI HPI Comments: Alicia Fleming is a 48 y.o. female who presents to the Emergency Department complaining of right ear pain onset 2 days prior. She states she has decreased hearing. She state she tried to clean her ears Monday and she thinks she may have pushed the ear wax too far down into her ear. She states she has been trying OTC with no relief. She denies any complications with her left ear.   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      Objective:   BP 112/70  Pulse 94  Temp(Src) 98 F (36.7 C) (Oral)  Resp 18  Ht 5' 7.5" (1.715 m)  Wt 170 lb (77.111 kg)  BMI 26.22 kg/m2  SpO2 100%  LMP 11/11/2013   Physical Exam  Nursing note and vitals reviewed. Constitutional: She is oriented to person, place, and time. She appears well-developed and well-nourished. No distress.  HENT:  Head: Normocephalic and atraumatic.  Right Ear: Decreased hearing is noted.  Left Ear: Tympanic membrane and external ear normal.  Cerumen impacted in right ear canal.   Eyes: Conjunctivae and EOM are normal.  Neck: Neck supple. No tracheal deviation present.  Cardiovascular: Normal rate.   Pulmonary/Chest: Effort normal. No respiratory distress.  Musculoskeletal: Normal range of motion.  Neurological: She is alert and oriented to person, place, and time.  Skin: Skin is warm and dry.  Psychiatric: She has a normal mood and affect. Her behavior is normal.    Assessment & Plan:   Ear was irrigated with relief by patient when cerumen was removed using only  water.  Robyn Haber, Md

## 2014-03-07 ENCOUNTER — Encounter (HOSPITAL_COMMUNITY): Payer: Self-pay | Admitting: Internal Medicine

## 2014-06-22 DIAGNOSIS — Y9289 Other specified places as the place of occurrence of the external cause: Secondary | ICD-10-CM | POA: Insufficient documentation

## 2014-06-22 DIAGNOSIS — J45909 Unspecified asthma, uncomplicated: Secondary | ICD-10-CM | POA: Diagnosis not present

## 2014-06-22 DIAGNOSIS — Z79899 Other long term (current) drug therapy: Secondary | ICD-10-CM | POA: Diagnosis not present

## 2014-06-22 DIAGNOSIS — W01198A Fall on same level from slipping, tripping and stumbling with subsequent striking against other object, initial encounter: Secondary | ICD-10-CM | POA: Diagnosis not present

## 2014-06-22 DIAGNOSIS — Y9389 Activity, other specified: Secondary | ICD-10-CM | POA: Insufficient documentation

## 2014-06-22 DIAGNOSIS — Z8679 Personal history of other diseases of the circulatory system: Secondary | ICD-10-CM | POA: Insufficient documentation

## 2014-06-22 DIAGNOSIS — M1990 Unspecified osteoarthritis, unspecified site: Secondary | ICD-10-CM | POA: Diagnosis not present

## 2014-06-22 DIAGNOSIS — S2232XA Fracture of one rib, left side, initial encounter for closed fracture: Secondary | ICD-10-CM | POA: Insufficient documentation

## 2014-06-22 DIAGNOSIS — Y998 Other external cause status: Secondary | ICD-10-CM | POA: Insufficient documentation

## 2014-06-22 DIAGNOSIS — S3992XA Unspecified injury of lower back, initial encounter: Secondary | ICD-10-CM | POA: Diagnosis present

## 2014-06-23 ENCOUNTER — Emergency Department (HOSPITAL_COMMUNITY): Payer: 59

## 2014-06-23 ENCOUNTER — Encounter (HOSPITAL_COMMUNITY): Payer: Self-pay | Admitting: Oncology

## 2014-06-23 ENCOUNTER — Emergency Department (HOSPITAL_COMMUNITY)
Admission: EM | Admit: 2014-06-23 | Discharge: 2014-06-23 | Disposition: A | Discharge status: Home / Self Care | Payer: 59 | Attending: Emergency Medicine | Admitting: Emergency Medicine

## 2014-06-23 DIAGNOSIS — S2232XA Fracture of one rib, left side, initial encounter for closed fracture: Secondary | ICD-10-CM

## 2014-06-23 LAB — URINALYSIS, ROUTINE W REFLEX MICROSCOPIC
Bilirubin Urine: NEGATIVE
Glucose, UA: NEGATIVE mg/dL
Hgb urine dipstick: NEGATIVE
Ketones, ur: NEGATIVE mg/dL
LEUKOCYTES UA: NEGATIVE
NITRITE: NEGATIVE
PH: 6 (ref 5.0–8.0)
Protein, ur: NEGATIVE mg/dL
SPECIFIC GRAVITY, URINE: 1.001 — AB (ref 1.005–1.030)
Urobilinogen, UA: 0.2 mg/dL (ref 0.0–1.0)

## 2014-06-23 MED ORDER — OXYCODONE-ACETAMINOPHEN 5-325 MG PO TABS
2.0000 | ORAL_TABLET | Freq: Once | ORAL | Status: AC
  Administered 2014-06-23: 2 via ORAL
  Filled 2014-06-23: qty 2

## 2014-06-23 MED ORDER — OXYCODONE-ACETAMINOPHEN 5-325 MG PO TABS: 1.0000 | ORAL_TABLET | ORAL | Status: DC | PRN

## 2014-06-23 NOTE — ED Notes (Signed)
Pt reported slipping and falling in bathroom and hitting lt lateral torso area on commode. No bruising/deformity noted.

## 2014-06-23 NOTE — ED Notes (Signed)
Pt given instructions on how to use IS with returned demonstration.

## 2014-06-23 NOTE — ED Notes (Signed)
Per pt she slipped and fell in her bathroom on her left side knocking the wind out of her.  Pt is c/o pain in her left side extending to her back.  Denies LOC or hitting her head.

## 2014-06-23 NOTE — ED Notes (Addendum)
Pt returned from BR with urine specimen. Noted specimen clear (without color) and container was cold to touch. Baker Janus, NP aware.

## 2014-06-23 NOTE — ED Notes (Signed)
Bed: WA05 Expected date:  Expected time:  Means of arrival:  Comments: 

## 2014-06-23 NOTE — ED Notes (Signed)
Awake. Verbally responsive. A/O x4. Resp even and unlabored. No audible adventitious breath sounds noted. ABC's intact.  

## 2014-06-23 NOTE — Discharge Instructions (Signed)
Rib Fracture °A rib fracture is a break or crack in one of the bones of the ribs. The ribs are a group of long, curved bones that wrap around your chest and attach to your spine. They protect your lungs and other organs in the chest cavity. A broken or cracked rib is often painful, but most do not cause other problems. Most rib fractures heal on their own over time. However, rib fractures can be more serious if multiple ribs are broken or if broken ribs move out of place and push against other structures. °CAUSES  °· A direct blow to the chest. For example, this could happen during contact sports, a car accident, or a fall against a hard object. °· Repetitive movements with high force, such as pitching a baseball or having severe coughing spells. °SYMPTOMS  °· Pain when you breathe in or cough. °· Pain when someone presses on the injured area. °DIAGNOSIS  °Your caregiver will perform a physical exam. Various imaging tests may be ordered to confirm the diagnosis and to look for related injuries. These tests may include a chest X-ray, computed tomography (CT), magnetic resonance imaging (MRI), or a bone scan. °TREATMENT  °Rib fractures usually heal on their own in 1-3 months. The longer healing period is often associated with a continued cough or other aggravating activities. During the healing period, pain control is very important. Medication is usually given to control pain. Hospitalization or surgery may be needed for more severe injuries, such as those in which multiple ribs are broken or the ribs have moved out of place.  °HOME CARE INSTRUCTIONS  °· Avoid strenuous activity and any activities or movements that cause pain. Be careful during activities and avoid bumping the injured rib. °· Gradually increase activity as directed by your caregiver. °· Only take over-the-counter or prescription medications as directed by your caregiver. Do not take other medications without asking your caregiver first. °· Apply ice  to the injured area for the first 1-2 days after you have been treated or as directed by your caregiver. Applying ice helps to reduce inflammation and pain. °¨ Put ice in a plastic bag. °¨ Place a towel between your skin and the bag.   °¨ Leave the ice on for 15-20 minutes at a time, every 2 hours while you are awake. °· Perform deep breathing as directed by your caregiver. This will help prevent pneumonia, which is a common complication of a broken rib. Your caregiver may instruct you to: °¨ Take deep breaths several times a day. °¨ Try to cough several times a day, holding a pillow against the injured area. °¨ Use a device called an incentive spirometer to practice deep breathing several times a day. °· Drink enough fluids to keep your urine clear or pale yellow. This will help you avoid constipation.   °· Do not wear a rib belt or binder. These restrict breathing, which can lead to pneumonia.   °SEEK IMMEDIATE MEDICAL CARE IF:  °· You have a fever.   °· You have difficulty breathing or shortness of breath.   °· You develop a continual cough, or you cough up thick or bloody sputum. °· You feel sick to your stomach (nausea), throw up (vomit), or have abdominal pain.   °· You have worsening pain not controlled with medications.   °MAKE SURE YOU: °· Understand these instructions. °· Will watch your condition. °· Will get help right away if you are not doing well or get worse. °Document Released: 03/15/2005 Document Revised: 11/15/2012 Document Reviewed:   05/17/2012 Exit[employer] Patient Information 2015 [place], [place]. This information is not intended to replace advice given to you by your health care provider. Make sure you discuss any questions you have with your health care provider. Your x-ray shows that you have a ninth rib fracture on the left.  He been given medication for pain control, as well as an incentive spirometer.  Please uses 4-5 times a day, 5-10 breaths make an appointment with your primary care  physician for follow-up

## 2014-06-23 NOTE — ED Provider Notes (Signed)
CSN: [id number]     Arrival date & time 06/22/14  2354 History   First MD Initiated Contact with Patient 06/23/14 0048     Chief Complaint  Patient presents with  . Back Pain     (Consider location/radiation/quality/duration/timing/severity/associated sxs/prior Treatment) HPI Comments: Patient reports that she slipped in her bathroom falling against the commode hitting her lateral posterior left rib cage she reports to the emergency department with increased pain with movement deep breath did not hit her head she's not having any neck or abdominal pain she has not taken any medication for her discomfort happened approximately 1 hour. First a reports that she hit her left knee but is able to ambulate without discomfort  Patient is a 49 y.o. female presenting with back pain. The history is provided by the patient.  Back Pain Location:  Thoracic spine Quality:  Aching Radiates to:  Does not radiate Pain severity:  Moderate Pain is:  Same all the time Onset quality:  Sudden Duration:  2 hours Timing:  Constant Progression:  Unchanged Chronicity:  New Context: falling   Relieved by:  None tried Worsened by:  Coughing, deep breathing, movement and twisting Ineffective treatments:  None tried Associated symptoms: no fever and no numbness     Past Medical History  Diagnosis Date  . Arthritis   . Rheumatoid arthritis(714.0)   . Other specified cardiac dysrhythmias(427.89)     SVT  . Allergy   . Asthma    Past Surgical History  Procedure Laterality Date  . 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 Small intestine surgery    . Supraventricular tachycardia ablation N/A 06/30/2012    Procedure: SUPRAVENTRICULAR TACHYCARDIA ABLATION;  Surgeon: Evans Lance, MD;  Location: [facility] CATH LAB;  Service: Cardiovascular;  Laterality: N/A;   Family History  Problem Relation Age of Onset  . Hypertension Mother   . Hypertension Father   . Stroke Maternal  Grandmother   . Stroke Maternal Grandfather   . Heart disease Paternal Grandfather    History  Substance Use Topics  . Smoking status: Never Smoker   . Smokeless tobacco: Not on file  . Alcohol Use: No   OB History    No data available     Review of Systems  Constitutional: Negative for fever.  Respiratory: Negative for cough and shortness of breath.   Musculoskeletal: Positive for back pain.  Neurological: Negative for dizziness and numbness.  All other systems reviewed and are negative.     Allergies  Review of patient's allergies indicates no known allergies.  Home Medications   Prior to Admission medications   Medication Sig Start Date End Date Taking? Authorizing Provider  cetirizine (ZYRTEC) 10 MG tablet Take 10 mg by mouth daily.   Yes Historical Provider, MD  ferrous sulfate 325 (65 FE) MG tablet Take 325 mg by mouth daily with breakfast.   Yes Historical Provider, MD  fluticasone (FLONASE) 50 MCG/ACT nasal spray Place 1 spray into both nostrils daily as needed for allergies or rhinitis.   Yes Historical Provider, MD  Multiple Vitamin (MULTIVITAMIN WITH MINERALS) TABS tablet Take 1 tablet by mouth daily.   Yes Historical Provider, MD  naproxen sodium (ANAPROX) 220 MG tablet Take 440 mg by mouth 2 (two) times daily as needed (pain).   Yes Historical Provider, MD  oxyCODONE-acetaminophen (PERCOCET/ROXICET) 5-325 MG per tablet Take 1 tablet by mouth every 4 (four) hours as needed for severe pain.  06/23/14   Junius Creamer, NP   BP 139/66 mmHg  Pulse 109  Temp(Src) 97.6 F (36.4 C) (Oral)  Resp 18  Ht 5\' 7"  (1.702 m)  Wt 171 lb (77.565 kg)  BMI 26.78 kg/m2  SpO2 100%  LMP 05/28/2014 Physical Exam  Constitutional: She is oriented to person, place, and time. She appears well-developed and well-nourished. She appears distressed.  HENT:  Head: Normocephalic.  Mouth/Throat: Oropharynx is clear and moist.  Eyes: Pupils are equal, round, and reactive to light.  Neck:  Normal range of motion.  Cardiovascular: Normal rate and regular rhythm.   Pulmonary/Chest: Effort normal and breath sounds normal. No respiratory distress. She has no rales.   She exhibits tenderness.  Abdominal: Soft.  Musculoskeletal: Normal range of motion.  Lymphadenopathy:    She has no cervical adenopathy.  Neurological: She is alert and oriented to person, place, and time.  Skin: Skin is warm and dry. No erythema.  Psychiatric: She has a normal mood and affect.  Nursing note and vitals reviewed.   ED Course  Procedures (including critical care time) Labs Review Labs Reviewed  URINALYSIS, ROUTINE W REFLEX MICROSCOPIC    Imaging Review Dg Ribs Unilateral W/chest Left  06/23/2014   CLINICAL DATA:  Slipped, fell in bathroom, landing on left side. Left posterior rib pain.  EXAM: LEFT RIBS AND CHEST - 3+ VIEW  COMPARISON:  04/30/2012  FINDINGS: There is a left lateral ninth rib fracture. Bibasilar atelectasis. No pneumothorax. Heart is normal size.  IMPRESSION: Left lateral ninth rib fracture.  Bibasilar atelectasis.   Electronically Signed   By: Rolm Baptise M.D.   On: 06/23/2014 01:45     EKG Interpretation None     patient has a nondisplaced posterior left rib fracture.  She's been given an incentive spirometer prescription for Percocet, instructions to follow-up with her primary care physician  MDM   Final diagnoses:  Closed rib fracture, left, initial encounter         Junius Creamer, NP 06/23/14 0203  Junius Creamer, NP 06/23/14 5102  Shanon Rosser, MD 06/23/14 [phone]

## 2015-01-07 ENCOUNTER — Encounter (HOSPITAL_COMMUNITY): Payer: Self-pay | Admitting: Emergency Medicine

## 2015-01-07 ENCOUNTER — Emergency Department (HOSPITAL_COMMUNITY): Payer: 59

## 2015-01-07 ENCOUNTER — Emergency Department (HOSPITAL_COMMUNITY)
Admission: EM | Admit: 2015-01-07 | Discharge: 2015-01-07 | Disposition: A | Discharge status: Home / Self Care | Payer: 59 | Attending: Emergency Medicine | Admitting: Emergency Medicine

## 2015-01-07 DIAGNOSIS — R109 Unspecified abdominal pain: Secondary | ICD-10-CM | POA: Diagnosis present

## 2015-01-07 DIAGNOSIS — Z8679 Personal history of other diseases of the circulatory system: Secondary | ICD-10-CM | POA: Diagnosis not present

## 2015-01-07 DIAGNOSIS — Z7951 Long term (current) use of inhaled steroids: Secondary | ICD-10-CM | POA: Diagnosis not present

## 2015-01-07 DIAGNOSIS — J45909 Unspecified asthma, uncomplicated: Secondary | ICD-10-CM | POA: Diagnosis not present

## 2015-01-07 DIAGNOSIS — K529 Noninfective gastroenteritis and colitis, unspecified: Secondary | ICD-10-CM | POA: Insufficient documentation

## 2015-01-07 DIAGNOSIS — Z79899 Other long term (current) drug therapy: Secondary | ICD-10-CM | POA: Insufficient documentation

## 2015-01-07 DIAGNOSIS — M069 Rheumatoid arthritis, unspecified: Secondary | ICD-10-CM | POA: Insufficient documentation

## 2015-01-07 DIAGNOSIS — Z3202 Encounter for pregnancy test, result negative: Secondary | ICD-10-CM | POA: Diagnosis not present

## 2015-01-07 LAB — URINALYSIS, ROUTINE W REFLEX MICROSCOPIC
Bilirubin Urine: NEGATIVE
GLUCOSE, UA: NEGATIVE mg/dL
HGB URINE DIPSTICK: NEGATIVE
Ketones, ur: >80 mg/dL — AB
Leukocytes, UA: NEGATIVE
Nitrite: NEGATIVE
PH: 6.5 (ref 5.0–8.0)
Protein, ur: NEGATIVE mg/dL
Urobilinogen, UA: 0.2 mg/dL (ref 0.0–1.0)

## 2015-01-07 LAB — CBC
HEMATOCRIT: 30 % — AB (ref 36.0–46.0)
HEMOGLOBIN: 9.8 g/dL — AB (ref 12.0–15.0)
MCH: 24 pg — AB (ref 26.0–34.0)
MCHC: 32.7 g/dL (ref 30.0–36.0)
MCV: 73.3 fL — AB (ref 78.0–100.0)
Platelets: 152 10*3/uL (ref 150–400)
RBC: 4.09 MIL/uL (ref 3.87–5.11)
RDW: 16.7 % — AB (ref 11.5–15.5)
WBC: 4.8 10*3/uL (ref 4.0–10.5)

## 2015-01-07 LAB — COMPREHENSIVE METABOLIC PANEL
ALBUMIN: 3.5 g/dL (ref 3.5–5.0)
ALT: 10 U/L — ABNORMAL LOW (ref 14–54)
ANION GAP: 9 (ref 5–15)
AST: 18 U/L (ref 15–41)
Alkaline Phosphatase: 61 U/L (ref 38–126)
CO2: 21 mmol/L — AB (ref 22–32)
Calcium: 9.3 mg/dL (ref 8.9–10.3)
Chloride: 102 mmol/L (ref 101–111)
Creatinine, Ser: 0.59 mg/dL (ref 0.44–1.00)
GFR calc Af Amer: >60 mL/min (ref >60)
GFR calc non Af Amer: >60 mL/min (ref >60)
GLUCOSE: 136 mg/dL — AB (ref 65–99)
POTASSIUM: 3.7 mmol/L (ref 3.5–5.1)
SODIUM: 132 mmol/L — AB (ref 135–145)
Total Bilirubin: 0.5 mg/dL (ref 0.3–1.2)
Total Protein: 7.7 g/dL (ref 6.5–8.1)

## 2015-01-07 LAB — LIPASE, BLOOD: Lipase: 24 U/L (ref 22–51)

## 2015-01-07 LAB — I-STAT BETA HCG BLOOD, ED (MC, WL, AP ONLY)

## 2015-01-07 LAB — I-STAT CG4 LACTIC ACID, ED: Lactic Acid, Venous: 0.99 mmol/L (ref 0.5–2.0)

## 2015-01-07 MED ORDER — PANTOPRAZOLE SODIUM 20 MG PO TBEC: 20.0000 mg | DELAYED_RELEASE_TABLET | Freq: Every day | ORAL | Status: DC

## 2015-01-07 MED ORDER — PANTOPRAZOLE SODIUM 40 MG IV SOLR
40.0000 mg | Freq: Once | INTRAVENOUS | Status: AC
  Administered 2015-01-07: 40 mg via INTRAVENOUS
  Filled 2015-01-07: qty 40

## 2015-01-07 MED ORDER — MORPHINE SULFATE (PF) 4 MG/ML IV SOLN
4.0000 mg | Freq: Once | INTRAVENOUS | Status: AC
  Administered 2015-01-07: 4 mg via INTRAVENOUS
  Filled 2015-01-07: qty 1

## 2015-01-07 MED ORDER — SODIUM CHLORIDE 0.9 % IV BOLUS (SEPSIS)
1000.0000 mL | Freq: Once | INTRAVENOUS | Status: AC
  Administered 2015-01-07: 1000 mL via INTRAVENOUS

## 2015-01-07 MED ORDER — HYDROCODONE-ACETAMINOPHEN 5-325 MG PO TABS: 1.0000 | ORAL_TABLET | ORAL | Status: DC | PRN

## 2015-01-07 MED ORDER — IOHEXOL 300 MG/ML  SOLN
100.0000 mL | Freq: Once | INTRAMUSCULAR | Status: AC | PRN
  Administered 2015-01-07: 100 mL via INTRAVENOUS

## 2015-01-07 MED ORDER — PROMETHAZINE HCL 25 MG/ML IJ SOLN
12.5000 mg | Freq: Once | INTRAMUSCULAR | Status: AC
  Administered 2015-01-07: 12.5 mg via INTRAVENOUS
  Filled 2015-01-07: qty 1

## 2015-01-07 MED ORDER — ONDANSETRON HCL 4 MG/2ML IJ SOLN
4.0000 mg | Freq: Once | INTRAMUSCULAR | Status: AC | PRN
  Administered 2015-01-07: 4 mg via INTRAVENOUS
  Filled 2015-01-07: qty 2

## 2015-01-07 MED ORDER — ONDANSETRON HCL 4 MG PO TABS: 4.0000 mg | ORAL_TABLET | Freq: Four times a day (QID) | ORAL | Status: DC

## 2015-01-07 MED ORDER — METOCLOPRAMIDE HCL 5 MG/ML IJ SOLN
10.0000 mg | Freq: Once | INTRAMUSCULAR | Status: AC
  Administered 2015-01-07: 10 mg via INTRAVENOUS
  Filled 2015-01-07: qty 2

## 2015-01-07 NOTE — Discharge Instructions (Signed)
Food Poisoning Food poisoning is an illness caused by something you ate or drank. There are over 250 known causes of food poisoning. However, many other causes are unknown.You can be treated even if the exact cause of your food poisoning is not known. In most cases, food poisoning is mild and lasts 1 to 2 days but can last longer. However, some cases can be serious, especially for people with low immune systems, the elderly, children and infants, and pregnant women. CAUSES  Poor personal hygiene, improper cleaning of storage and preparation areas, and unclean utensils can cause infection or tainting (contamination) of foods. The causes of food poisoning are numerous.Infectious agents, such as viruses, bacteria, or parasites, can cause harm by infecting the intestine and disrupting the absorption of nutrients and water. This can cause diarrhea and lead to dehydration. Viruses are responsible for most of the food poisonings in which an agent is found. Parasites are less likely to cause food poisoning. Toxic agents, such as poisonous mushrooms, marine algae, and pesticides can also cause food poisoning.  Viral causes of food poisoning include:  Norovirus.  Rotavirus.  Hepatitis A.  Bacterial causes of food poisoning include:  Salmonellae.  Campylobacter.  Bacillus cereus.  Escherichia coli (E. coli).  Shigella.  Listeria monocytogenes.  Clostridium botulinum (botulism).  Vibrio cholerae.  Parasites that can cause food poisoning include:  Giardia.  Cryptosporidium.  Toxoplasma. SYMPTOMS Symptoms may appear several hours or longer after consuming the contaminated food or drink. Symptoms may include:  Nausea.  Vomiting.  Cramping.  Diarrhea.  Fever and chills.  Muscle aches. DIAGNOSIS Your health care provider may be able to diagnose food poisoning from a list of what you have recently eaten and results from lab tests. Diagnostic tests may include an exam of the  feces. TREATMENT In most cases, treatment focuses on helping to relieve your symptoms and staying well hydrated. Antibiotic medicines are rarely needed. In severe cases, hospitalization may be required. HOME CARE INSTRUCTIONS   Drink enough water and fluids to keep your urine clear or pale yellow. Drink small amounts of fluids frequently and increase as tolerated.  Ask your health care provider for specific rehydration instructions.  Avoid:  Foods high in sugar.  Alcohol.  Carbonated drinks.  Tobacco.  Juice.  Caffeine drinks.  Extremely hot or cold fluids.  Fatty, greasy foods.  Too much intake of anything at one time.  Dairy products until 24 to 48 hours after diarrhea stops.  You may consume probiotics. Probiotics are active cultures of beneficial bacteria. They may lessen the amount and number of diarrheal stools in adults. Probiotics can be found in yogurt with active cultures and in supplements.  Wash your hands well to avoid spreading the bacteria.  Take medicines only as directed by your health care provider. Do not give your child aspirin because of the association with Reye's syndrome.  Ask your health care provider if you should continue to take your regular prescribed and over-the-counter medicines. PREVENTION   Wash your hands, food preparation surfaces, and utensils thoroughly before and after handling raw foods.  Keep refrigerated foods below 16F (5C).  Serve hot foods immediately or keep them heated above 116F (60C).  Divide large volumes of food into small portions for rapid cooling in the refrigerator. Hot, bulky foods in the refrigerator can raise the temperature of other foods that have already cooled.  Follow approved canning procedures.  Heat canned foods thoroughly before tasting.  When in doubt, throw it out.  Infants, the elderly, women who are pregnant, and people with compromised immune systems are especially susceptible to food  poisoning. These people should never consume unpasteurized cheese, unpasteurized cider, raw fish, raw seafood, or raw meat-type products. SEEK IMMEDIATE MEDICAL CARE IF:   You have difficulty breathing, swallowing, talking, or moving.  You develop blurred vision.  You are unable to keep fluids down.  You faint or nearly faint.  Your eyes turn yellow.  Vomiting or diarrhea develops or becomes persistent.  Abdominal pain develops, increases, or localizes in one small area.  You have a fever.  The diarrhea becomes excessive or contains blood or mucus.  You develop excessive weakness, dizziness, or extreme thirst.  You have no urine for 8 hours. MAKE SURE YOU:   Understand these instructions.  Will watch your condition.  Will get help right away if you are not doing well or get worse.   This information is not intended to replace advice given to you by your health care provider. Make sure you discuss any questions you have with your health care provider.   Document Released: 12/12/2003 Document Revised: 04/05/2014 Document Reviewed: 09/16/2014 Elsevier Interactive Patient Education [employer].

## 2015-01-07 NOTE — ED Notes (Signed)
Pt reports periumbilical pain for the last 3 days. Pt states pain increased suddenly last night at 11pm. Pt has vomited all night. Pt has hx of bowel obstruction. Last BM yesterday. Pt distressed due to pain and vomiting. VSS.

## 2015-01-07 NOTE — ED Provider Notes (Signed)
CSN: [id number]     Arrival date & time 01/07/15  0945 History   First MD Initiated Contact with Patient 01/07/15 1003     Chief Complaint  Patient presents with  . Abdominal Pain     (Consider location/radiation/quality/duration/timing/severity/associated sxs/prior Treatment) HPI   PCP: Merrilee Seashore, MD PMH: small bowel obstruction, rheumatoid arthritis, allergy, asthma,  Alicia Fleming is a 49 y.o.  female  CHIEF COMPLAINT: abdominal pain with nausea and vomiting.   Pt reports having not feeling well over the last couple of days. She reports eating a meal and then shortly after having nausea and vomiting. As the days have progressed she has had increasingly worsening abdominal pain. Then acutely at 11 PM last night her pain worsened with more episodes and more forceful vomiting. This morning she decided to come get evaluated.  He has a low-grade temperature of 100.4 in triage. She last had a normal bowel movement yesterday and denies having any diarrhea. Her pain is epigastric and without any right upper quadrant pain. She reports that her stomach feels like it is clenched in a knot. He has not had any dysuria, hematuria, vaginal discharge, vaginal bleeding area did she denies flank pain or CP.   Past Medical History  Diagnosis Date  . Arthritis   . Rheumatoid arthritis(714.0)   . Other specified cardiac dysrhythmias(427.89)     SVT  . Allergy   . Asthma    Past Surgical History  Procedure Laterality Date  . 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 Small intestine surgery    . Supraventricular tachycardia ablation N/A 06/30/2012    Procedure: SUPRAVENTRICULAR TACHYCARDIA ABLATION;  Surgeon: Evans Lance, MD;  Location: [facility] CATH LAB;  Service: Cardiovascular;  Laterality: N/A;   Family History  Problem Relation Age of Onset  . Hypertension Mother   . Hypertension Father   . Stroke Maternal Grandmother   . Stroke Maternal Grandfather    . Heart disease Paternal Grandfather    Social History  Substance Use Topics  . Smoking status: Never Smoker   . Smokeless tobacco: None  . Alcohol Use: Yes     Comment: occasionally   OB History    No data available     Review of Systems  10 Systems reviewed and are negative for acute change except as noted in the HPI.    Allergies  Review of patient's allergies indicates no known allergies.  Home Medications   Prior to Admission medications   Medication Sig Start Date End Date Taking? Authorizing Provider  cetirizine (ZYRTEC) 10 MG tablet Take 10 mg by mouth daily.   Yes Historical Provider, MD  fluticasone (FLONASE) 50 MCG/ACT nasal spray Place 1 spray into both nostrils daily as needed for allergies or rhinitis.   Yes Historical Provider, MD  oxyCODONE-acetaminophen (PERCOCET/ROXICET) 5-325 MG per tablet Take 1 tablet by mouth every 4 (four) hours as needed for severe pain. 06/23/14  Yes Junius Creamer, NP  ferrous sulfate 325 (65 FE) MG tablet Take 325 mg by mouth daily with breakfast.    Historical Provider, MD  HYDROcodone-acetaminophen (NORCO/VICODIN) 5-325 MG tablet Take 1-2 tablets by mouth every 4 (four) hours as needed. 01/07/15   Delos Haring, PA-C  Multiple Vitamin (MULTIVITAMIN WITH MINERALS) TABS tablet Take 1 tablet by mouth daily.    Historical Provider, MD  naproxen sodium (ANAPROX) 220 MG tablet Take 440 mg by mouth 2 (two) times daily as needed (pain).  Historical Provider, MD  ondansetron (ZOFRAN) 4 MG tablet Take 1 tablet (4 mg total) by mouth every 6 (six) hours. 01/07/15   Bayley Yarborough Carlota Raspberry, PA-C  pantoprazole (PROTONIX) 20 MG tablet Take 1 tablet (20 mg total) by mouth daily. 01/07/15   Davidson Palmieri Carlota Raspberry, PA-C   BP 108/46 mmHg  Pulse 40  Temp(Src) 100.4 F (38 C) (Rectal)  Resp 18  SpO2 100%  LMP 12/11/2014 Physical Exam  Constitutional: She appears well-developed and well-nourished. No distress.  HENT:  Head: Normocephalic and atraumatic.  Eyes:  Pupils are equal, round, and reactive to light.  Neck: Normal range of motion. Neck supple.  Cardiovascular: Normal rate and regular rhythm.   Pulmonary/Chest: Effort normal.  Abdominal: Soft. There is no rebound, no guarding and no CVA tenderness.    Pain to the epigastrium, pt denies that pain is made significantly worse by palpation. No RUQ or RLQ pain. No suprapubic pain.  Neurological: She is alert.  Skin: Skin is warm and dry.  Nursing note and vitals reviewed.   ED Course  Procedures (including critical care time) Labs Review Labs Reviewed  COMPREHENSIVE METABOLIC PANEL - Abnormal; Notable for the following:    Sodium 132 (*)    CO2 21 (*)    Glucose, Bld 136 (*)    BUN <5 (*)    ALT 10 (*)    All other components within normal limits  CBC - Abnormal; Notable for the following:    Hemoglobin 9.8 (*)    HCT 30.0 (*)    MCV 73.3 (*)    MCH 24.0 (*)    RDW 16.7 (*)    All other components within normal limits  URINALYSIS, ROUTINE W REFLEX MICROSCOPIC (NOT AT [facility]) - Abnormal; Notable for the following:    Specific Gravity, Urine >1.046 (*)    Ketones, ur >80 (*)    All other components within normal limits  LIPASE, BLOOD  I-STAT BETA HCG BLOOD, ED ([facility], [facility], [facility] ONLY)  I-STAT CG4 LACTIC ACID, ED  I-STAT CG4 LACTIC ACID, ED    Imaging Review Ct Abdomen Pelvis W Contrast  01/07/2015   CLINICAL DATA:  Periumbilical pain for 3 days. Severe vomiting. Patient has a history of bowel obstruction.  EXAM: CT ABDOMEN AND PELVIS WITH CONTRAST  TECHNIQUE: Multidetector CT imaging of the abdomen and pelvis was performed using the standard protocol following bolus administration of intravenous contrast.  CONTRAST:  114mL OMNIPAQUE IOHEXOL 300 MG/ML  SOLN  COMPARISON:  06/06/2009  FINDINGS: The lung bases are unremarkable.  The spleen, adrenals, pancreas, kidneys are unremarkable. There is a subtle decreased attenuation with geographic appearance along the falciform ligament of the  liver. Gallbladder is folded, but otherwise normal in appearance.  There is no evidence of bowel obstruction, enteritis, colitis, diverticulitis, nor appendicitis. Prior small bowel resection is noted in the mid abdomen. Small outpouching measuring 2.4 cm is seen at the enterotomy line. There is no evidence of appendicitis. The colon is decompressed.  There is no evidence of abdominal aortic aneurysm. The celiac, SMA, IMA, portal vein, SMV are opacified.  There is no evidence of abdominal or pelvic free fluid, loculated fluid collections, masses, nor adenopathy scant mural calcifications identified within the aorta and mesenteric vessels.  The uterus is enlarged and globular containing multiple myometrial masses. There is a mostly exophytic myometrial mass off of the left uterine fundus extends well into the upper abdomen which measures 9.2 by 6.7 by 8.8 cm. It has significantly increased in size  when compared to the prior study. There are bilateral ovarian cysts, the larger cyst on the right ovary measures 4 cm.  There is no evidence of abdominal wall or inguinal hernia.  There is no evidence of aggressive appearing osseous lesions.  IMPRESSION: Subtle hypoattenuated geographic area adjacent to the falciform ligament of the liver, which may represent focal fatty infiltration. Significant pathology such as soft tissue mass, or liver abscess is considered less likely. However, if there is any clinical suspicion for liver pathology, MRI of the abdomen may be obtained for further evaluation.  Status post small bowel resection, without evidence of small-bowel obstruction.  Large leiomyomatous uterus. There is a mostly exophytic 9.2 cm in largest dimension myometrial mass off of the left fundus of the uterus which demonstrates significant enlargement, when compared to the prior study. Ob-GYN consult may be considered.  Bilateral ovarian cysts, likely benign in a premenopausal patient.   Electronically Signed   By:  Fidela Salisbury M.D.   On: 01/07/2015 11:43   I have personally reviewed and evaluated these images and lab results as part of my medical decision-making.   EKG Interpretation None      MDM   Final diagnoses:  Gastroenteritis    Patients symptoms are consistent with food poisoning/gastroenteritis. He has a low grade fever with epigastric abdominal pain, nausea and vomiting. Her blood work shows a normal WBC, baseline hemoglobin, mildly abnormal CMP showing some mild dehydration, normal lipase, mildly abnormal urinalysis showing signs of dehydration. ET scan shows large fibrous uterus, the patient reports being aware of her cysts and fibrous uterus. She is not having any right upper quadrant pain and her gallbladder is decompressed on CT scan without signs of stones.    Patient given pain medicine, nausea medicine. She is dealing significantly improved. We'll oral challenge in ED.  Patient monitored and passed fluid challenge, Ginger Ale and Crackers in the ED without adverse events. She reports still some mild nausea but overall feeling much improved. She is no longer having any abdominal pain. pts symptoms most consistent with stomach flu, close f/u with PCP warranted.    ondansetron (ZOFRAN) 4 MG tablet Take 1 tablet (4 mg total) by mouth every 6 (six) hours. 12 tablet Delos Haring, PA-C    pantoprazole (PROTONIX) 20 MG tablet Take 1 tablet (20 mg total) by mouth daily. 30 tablet Delos Haring, PA-C   HYDROcodone-acetaminophen (NORCO/VICODIN) 5-325 MG tablet Take 1-2 tablets by mouth every 4 (four) hours as needed. 20 tablet Delos Haring, PA-C   Medications  ondansetron [facility]) injection 4 mg (4 mg Intravenous Given 01/07/15 1011)  morphine 4 MG/ML injection 4 mg (4 mg Intravenous Given 01/07/15 1012)  sodium chloride 0.9 % bolus 1,000 mL (0 mLs Intravenous Stopped 01/07/15 1246)  ondansetron (ZOFRAN) injection 4 mg (4 mg Intravenous Given 01/07/15 1039)  morphine 4 MG/ML  injection 4 mg (4 mg Intravenous Given 01/07/15 1040)  iohexol (OMNIPAQUE) 300 MG/ML solution 100 mL (100 mLs Intravenous Contrast Given 01/07/15 1049)  pantoprazole (PROTONIX) injection 40 mg (40 mg Intravenous Given 01/07/15 1241)  metoCLOPramide (REGLAN) injection 10 mg (10 mg Intravenous Given 01/07/15 1241)  promethazine (PHENERGAN) injection 12.5 mg (12.5 mg Intravenous Given 01/07/15 1305)    49 y.o.Alicia Fleming medical screening exam was performed and I feel the patient has had an appropriate workup for their chief complaint at this time and likelihood of emergent condition existing is low. They have been counseled on decision, discharge, follow up and which symptoms  necessitate immediate return to the emergency department. They or their family verbally stated understanding and agreement with plan and discharged in stable condition.   Vital signs are stable at discharge. Filed Vitals:   01/07/15 1015  BP: 108/46  Pulse: 40  Temp: 100.4 F (38 C)  Resp:       Delos Haring, PA-C 01/07/15 Salisbury, DO 01/07/15 1436

## 2015-01-08 ENCOUNTER — Emergency Department (HOSPITAL_COMMUNITY): Payer: 59

## 2015-01-08 ENCOUNTER — Observation Stay (HOSPITAL_COMMUNITY)
Admission: EM | Admit: 2015-01-08 | Discharge: 2015-01-10 | Disposition: A | Discharge status: Home / Self Care | Payer: 59 | Attending: Internal Medicine | Admitting: Internal Medicine

## 2015-01-08 ENCOUNTER — Encounter (HOSPITAL_COMMUNITY): Payer: Self-pay

## 2015-01-08 ENCOUNTER — Observation Stay (HOSPITAL_COMMUNITY): Payer: 59

## 2015-01-08 DIAGNOSIS — R569 Unspecified convulsions: Secondary | ICD-10-CM | POA: Diagnosis present

## 2015-01-08 DIAGNOSIS — R55 Syncope and collapse: Secondary | ICD-10-CM | POA: Diagnosis not present

## 2015-01-08 DIAGNOSIS — E440 Moderate protein-calorie malnutrition: Secondary | ICD-10-CM | POA: Diagnosis present

## 2015-01-08 DIAGNOSIS — R9431 Abnormal electrocardiogram [ECG] [EKG]: Secondary | ICD-10-CM | POA: Diagnosis present

## 2015-01-08 DIAGNOSIS — I471 Supraventricular tachycardia: Secondary | ICD-10-CM | POA: Insufficient documentation

## 2015-01-08 DIAGNOSIS — I4581 Long QT syndrome: Secondary | ICD-10-CM | POA: Insufficient documentation

## 2015-01-08 DIAGNOSIS — J45909 Unspecified asthma, uncomplicated: Secondary | ICD-10-CM | POA: Insufficient documentation

## 2015-01-08 DIAGNOSIS — M069 Rheumatoid arthritis, unspecified: Secondary | ICD-10-CM | POA: Diagnosis not present

## 2015-01-08 DIAGNOSIS — R110 Nausea: Secondary | ICD-10-CM

## 2015-01-08 DIAGNOSIS — R1011 Right upper quadrant pain: Secondary | ICD-10-CM | POA: Diagnosis not present

## 2015-01-08 HISTORY — DX: Other supraventricular tachycardia: I47.19

## 2015-01-08 HISTORY — DX: Supraventricular tachycardia: I47.1

## 2015-01-08 LAB — CBC WITH DIFFERENTIAL/PLATELET
BASOS ABS: 0 10*3/uL (ref 0.0–0.1)
BASOS PCT: 0 %
EOS ABS: 0 10*3/uL (ref 0.0–0.7)
EOS PCT: 0 %
HCT: 28.6 % — ABNORMAL LOW (ref 36.0–46.0)
Hemoglobin: 9.5 g/dL — ABNORMAL LOW (ref 12.0–15.0)
LYMPHS PCT: 19 %
Lymphs Abs: 0.6 10*3/uL — ABNORMAL LOW (ref 0.7–4.0)
MCH: 24.1 pg — ABNORMAL LOW (ref 26.0–34.0)
MCHC: 33.2 g/dL (ref 30.0–36.0)
MCV: 72.6 fL — ABNORMAL LOW (ref 78.0–100.0)
Monocytes Absolute: 0.2 10*3/uL (ref 0.1–1.0)
Monocytes Relative: 5 %
Neutro Abs: 2.5 10*3/uL (ref 1.7–7.7)
Neutrophils Relative %: 75 %
Platelets: 117 10*3/uL — ABNORMAL LOW (ref 150–400)
RBC: 3.94 MIL/uL (ref 3.87–5.11)
RDW: 16.8 % — ABNORMAL HIGH (ref 11.5–15.5)
WBC: 3.3 10*3/uL — AB (ref 4.0–10.5)

## 2015-01-08 LAB — COMPREHENSIVE METABOLIC PANEL
ALBUMIN: 3.5 g/dL (ref 3.5–5.0)
ALT: 9 U/L — ABNORMAL LOW (ref 14–54)
AST: 19 U/L (ref 15–41)
Alkaline Phosphatase: 60 U/L (ref 38–126)
Anion gap: 11 (ref 5–15)
BILIRUBIN TOTAL: 0.3 mg/dL (ref 0.3–1.2)
CO2: 19 mmol/L — ABNORMAL LOW (ref 22–32)
Calcium: 9.4 mg/dL (ref 8.9–10.3)
Chloride: 106 mmol/L (ref 101–111)
Creatinine, Ser: 0.75 mg/dL (ref 0.44–1.00)
GFR calc Af Amer: >60 mL/min (ref >60)
GFR calc non Af Amer: >60 mL/min (ref >60)
GLUCOSE: 111 mg/dL — AB (ref 65–99)
POTASSIUM: 3.1 mmol/L — AB (ref 3.5–5.1)
Sodium: 136 mmol/L (ref 135–145)
TOTAL PROTEIN: 7.9 g/dL (ref 6.5–8.1)

## 2015-01-08 LAB — RAPID URINE DRUG SCREEN, HOSP PERFORMED
AMPHETAMINES: NOT DETECTED
BARBITURATES: NOT DETECTED
BENZODIAZEPINES: NOT DETECTED
Cocaine: NOT DETECTED
Opiates: NOT DETECTED
TETRAHYDROCANNABINOL: NOT DETECTED

## 2015-01-08 LAB — LIPASE, BLOOD: Lipase: 31 U/L (ref 22–51)

## 2015-01-08 MED ORDER — ALUM & MAG HYDROXIDE-SIMETH 200-200-20 MG/5ML PO SUSP
15.0000 mL | Freq: Once | ORAL | Status: AC
Start: 2015-01-08 — End: 2015-01-08
  Administered 2015-01-08: 15 mL via ORAL
  Filled 2015-01-08: qty 30

## 2015-01-08 MED ORDER — SODIUM CHLORIDE 0.9 % IV BOLUS (SEPSIS)
1000.0000 mL | Freq: Once | INTRAVENOUS | Status: AC
  Administered 2015-01-08: 1000 mL via INTRAVENOUS

## 2015-01-08 MED ORDER — SODIUM CHLORIDE 0.9 % IJ SOLN
3.0000 mL | Freq: Two times a day (BID) | INTRAMUSCULAR | Status: DC
  Administered 2015-01-08 – 2015-01-10 (×3): 3 mL via INTRAVENOUS

## 2015-01-08 MED ORDER — ONDANSETRON HCL 4 MG/2ML IJ SOLN
4.0000 mg | Freq: Four times a day (QID) | INTRAMUSCULAR | Status: DC | PRN
  Administered 2015-01-08 – 2015-01-09 (×5): 4 mg via INTRAVENOUS

## 2015-01-08 MED ORDER — MORPHINE SULFATE (PF) 4 MG/ML IV SOLN
4.0000 mg | Freq: Once | INTRAVENOUS | Status: AC
  Administered 2015-01-08: 4 mg via INTRAVENOUS
  Filled 2015-01-08: qty 1

## 2015-01-08 MED ORDER — BISACODYL 10 MG RE SUPP: 10.0000 mg | Freq: Every day | RECTAL | Status: DC | PRN

## 2015-01-08 MED ORDER — SODIUM CHLORIDE 0.9 % IV SOLN
INTRAVENOUS | Status: DC
  Administered 2015-01-08 – 2015-01-10 (×3): via INTRAVENOUS

## 2015-01-08 MED ORDER — ACETAMINOPHEN 650 MG RE SUPP: 650.0000 mg | Freq: Four times a day (QID) | RECTAL | Status: DC | PRN

## 2015-01-08 MED ORDER — ONDANSETRON HCL 4 MG PO TABS
4.0000 mg | ORAL_TABLET | Freq: Four times a day (QID) | ORAL | Status: DC | PRN
  Administered 2015-01-10 (×2): 4 mg via ORAL
  Filled 2015-01-08 (×2): qty 1

## 2015-01-08 MED ORDER — LIDOCAINE VISCOUS 2 % MT SOLN
15.0000 mL | Freq: Once | OROMUCOSAL | Status: AC
  Administered 2015-01-08: 15 mL via OROMUCOSAL
  Filled 2015-01-08: qty 15

## 2015-01-08 MED ORDER — ACETAMINOPHEN 325 MG PO TABS: 650.0000 mg | ORAL_TABLET | Freq: Four times a day (QID) | ORAL | Status: DC | PRN

## 2015-01-08 MED ORDER — ONDANSETRON HCL 4 MG/2ML IJ SOLN
4.0000 mg | Freq: Four times a day (QID) | INTRAMUSCULAR | Status: DC | PRN
  Filled 2015-01-08 (×3): qty 2

## 2015-01-08 MED ORDER — HEPARIN SODIUM (PORCINE) 5000 UNIT/ML IJ SOLN
5000.0000 [IU] | Freq: Three times a day (TID) | INTRAMUSCULAR | Status: DC
  Administered 2015-01-08 – 2015-01-10 (×4): 5000 [IU] via SUBCUTANEOUS
  Filled 2015-01-08 (×2): qty 1

## 2015-01-08 MED ORDER — PROMETHAZINE HCL 25 MG/ML IJ SOLN
12.5000 mg | Freq: Once | INTRAMUSCULAR | Status: AC
  Administered 2015-01-08: 12.5 mg via INTRAVENOUS
  Filled 2015-01-08: qty 1

## 2015-01-08 MED ORDER — HYDROCODONE-ACETAMINOPHEN 5-325 MG PO TABS
1.0000 | ORAL_TABLET | ORAL | Status: DC | PRN
  Administered 2015-01-08 – 2015-01-09 (×6): 2 via ORAL
  Filled 2015-01-08 (×2): qty 1
  Filled 2015-01-08 (×5): qty 2

## 2015-01-08 MED ORDER — POLYETHYLENE GLYCOL 3350 17 G PO PACK: 17.0000 g | PACK | Freq: Every day | ORAL | Status: DC | PRN

## 2015-01-08 MED ORDER — LORAZEPAM 2 MG/ML IJ SOLN
1.0000 mg | Freq: Once | INTRAMUSCULAR | Status: AC
  Administered 2015-01-08: 1 mg via INTRAVENOUS
  Filled 2015-01-08: qty 1

## 2015-01-08 MED ORDER — KETOROLAC TROMETHAMINE 30 MG/ML IJ SOLN
30.0000 mg | Freq: Once | INTRAMUSCULAR | Status: AC
  Administered 2015-01-08: 30 mg via INTRAVENOUS
  Filled 2015-01-08: qty 1

## 2015-01-08 NOTE — Consult Note (Signed)
Neurology Consultation Reason for Consult: Seizure Referring Physician: Elgergawy, D  CC: Seizure  History is obtained from: Patient, family  HPI: Alicia Fleming is a 49 y.o. female with no history of seizures who presents after losing consciousness and subsequently having dry shaking lasting approximately 10 minutes. The witness describes that her right side of her face appeared*twitching first followed by whole body twitching. Following this, the witness was not present as EMS had already arrived, apparently she was able to respond EMS on their arrival.   She was diagnosed with gastroenteritis yesterday, was started on Cipro and Phenergan.  ROS: A 14 point ROS was performed and is negative except as noted in the HPI.    Past Medical History  Diagnosis Date  . Arthritis   . Rheumatoid arthritis(714.0)   . Other specified cardiac dysrhythmias(427.89)     SVT  . Allergy   . Asthma      Family History  Problem Relation Age of Onset  . Hypertension Mother   . Hypertension Father   . Stroke Maternal Grandmother   . Stroke Maternal Grandfather   . Heart disease Paternal Grandfather      Social History:  reports that she has never smoked. She does not have any smokeless tobacco history on file. She reports that she drinks alcohol. She reports that she does not use illicit drugs.   Exam: Current vital signs: BP 111/61 mmHg  Pulse 50  Temp(Src) 98 F (36.7 C)  Resp 13  SpO2 100%  LMP 12/11/2014 Vital signs in last 24 hours: Temp:  [98 F (36.7 C)] 98 F (36.7 C) (10/12 1753) Pulse Rate:  [47-58] 50 (10/12 1800) Resp:  [13-23] 13 (10/12 1800) BP: (105-125)/(51-65) 111/61 mmHg (10/12 1800) SpO2:  [96 %-100 %] 100 % (10/12 1800)  Physical Exam  Constitutional: Appears well-developed and well-nourished.  Psych: Sleepy but responsive Eyes: No scleral injection HENT: No OP obstrucion Head: Normocephalic.  Cardiovascular: Normal rate and regular rhythm.  Respiratory:  Effort normal GI: tender but soft.  Skin: WDI  Neuro: Mental Status: Patient is awake, alert, oriented to person, place, month, year, and situation. Patient is able to give a clear and coherent history. No signs of aphasia or neglect Cranial Nerves: II: fixates and tracks. Pupils are equal, round, and reactive to light.   III,IV, VI: EOMI without ptosis or diploplia.  V: Facial sensation is symmetric to temperature VII: Facial movement is symmetric.  VIII: hearing is intact to voice X: Uvula elevates symmetrically XI: Shoulder shrug is symmetric. XII: tongue is midline without atrophy or fasciculations.  Motor: Tone is normal. Bulk is normal. 5/5 strength was present in all four extremities.  Sensory: Sensation is symmetric to light touch and temperature in the arms and legs. Deep Tendon Reflexes: 2+ and symmetric in the biceps and patellae.  Cerebellar: FNF with mild difficulty bilaterally         I have reviewed labs in epic and the results pertinent to this consultation are: Mild anemia nml glucose, na, ca  I have reviewed the images obtained:CT head - negative  Impression: 49 year old female with new onset seizure. Possibilities include lowering of seizure threshold by Cipro, though the degree to which Cipro lower seizure threshold is not completely clear. Also, seizures secondary to hypoperfusion due to arrhythmia given the prolonged QTC that was found on evaluation here in the setting of multiple QTC prolonging agents. Finally, unprovoked seizure would also be possible.  I would favor further evaluation with MRI  and EEG, holding fluoroquinolones, if all of this is negative and I'm not sure that she definitely needs to be on antiepileptics.  Recommendations: 1) MRI brain 2) EEG 3) further recommendations following the above workup. 4) if the patient were to have further seizures, then would load with Keppra 1 g followed by 500 mg twice a day   Roland Rack, MD [facility] [phone]  If 7pm- 7am, please page neurology on call as listed in [place].

## 2015-01-08 NOTE — ED Notes (Signed)
Patient transported to [facility] 

## 2015-01-08 NOTE — ED Provider Notes (Signed)
CSN: [id number]     Arrival date & time 01/08/15  1304 History   First MD Initiated Contact with Patient 01/08/15 1309     Chief Complaint  Patient presents with  . Seizures     (Consider location/radiation/quality/duration/timing/severity/associated sxs/prior Treatment) Patient is a 49 y.o. female presenting with seizures. The history is provided by the patient.  Seizures Seizure activity on arrival: no   Seizure type:  Grand mal Preceding symptoms: aura   Initial focality:  None Episode characteristics: unresponsiveness   Postictal symptoms: confusion and somnolence   Return to baseline: no   Severity:  Moderate Duration:  10 minutes Timing:  Once Number of seizures this episode:  1 Progression:  Unchanged Recent head injury:  No recent head injuries PTA treatment:  None History of seizures: no    49 yo F with a chief complaint of possible seizure versus syncope activity. This occurred just prior to arrival. Lasted about 10 minutes according to bystanders. Patient had some tonic jerking on the ground. Patient was seen here yesterday with a chief complaint of upper abdominal pain this been going on for a couple days. Patient was seen here had a negative CT scan of abdomen and pelvis was started on Zofran and Vicodin. Called her PCP who prescribed her ciprofloxacin and Phenergan. Patient then started taking all of these medications. Patient mildly confused on my exam.   Past Medical History  Diagnosis Date  . Rheumatoid arthritis(714.0)   . AVNRT (AV nodal re-entry tachycardia) ([place])     a. s/p ablation 2014 Dr Lovena Le  . Allergy   . Asthma    Past Surgical History  Procedure Laterality Date  . Small intestine surgery    . Supraventricular tachycardia ablation N/A 06/30/2012    AVNRT ablation by Dr Lovena Le   Family History  Problem Relation Age of Onset  . Hypertension Mother   . Hypertension Father   . Stroke Maternal Grandmother   . Stroke Maternal Grandfather   .  Heart disease Paternal Grandfather    Social History  Substance Use Topics  . Smoking status: Never Smoker   . Smokeless tobacco: None  . Alcohol Use: Yes     Comment: occasionally   OB History    No data available     Review of Systems  Constitutional: Negative for fever and chills.  HENT: Negative for congestion and rhinorrhea.   Eyes: Negative for redness and visual disturbance.  Respiratory: Negative for shortness of breath and wheezing.   Cardiovascular: Negative for chest pain and palpitations.  Gastrointestinal: Negative for nausea and vomiting.  Genitourinary: Negative for dysuria and urgency.  Musculoskeletal: Negative for myalgias and arthralgias.  Skin: Negative for pallor and wound.  Neurological: Positive for seizures. Negative for dizziness and headaches.      Allergies  Review of patient's allergies indicates no known allergies.  Home Medications   Prior to Admission medications   Medication Sig Start Date End Date Taking? Authorizing Provider  HYDROcodone-acetaminophen (NORCO/VICODIN) 5-325 MG tablet Take 1-2 tablets by mouth every 4 (four) hours as needed. 01/07/15  Yes Tiffany Carlota Raspberry, PA-C   BP 119/67 mmHg  Pulse 56  Temp(Src) 98.7 F (37.1 C) (Oral)  Resp 14  Ht 5\' 7"  (1.702 m)  Wt 157 lb 12.8 oz (71.578 kg)  BMI 24.71 kg/m2  SpO2 98%  LMP 12/11/2014 Physical Exam  Constitutional: She is oriented to person, place, and time. She appears well-developed and well-nourished. No distress.  HENT:  Head: Normocephalic and  atraumatic.  Eyes: EOM are normal. Pupils are equal, round, and reactive to light.  Neck: Normal range of motion. Neck supple.  Cardiovascular: Normal rate and regular rhythm.  Exam reveals no gallop and no friction rub.   No murmur heard. Pulmonary/Chest: Effort normal. She has no wheezes. She has no rales.  Abdominal: Soft. She exhibits no distension. There is tenderness (epigastric, negative murphys). There is no rebound and no  guarding.  Musculoskeletal: She exhibits no edema or tenderness.  Neurological: She is alert and oriented to person, place, and time.  Skin: Skin is warm and dry. She is not diaphoretic.  Psychiatric: She has a normal mood and affect. Her behavior is normal.    ED Course  Procedures (including critical care time) Labs Review Labs Reviewed  COMPREHENSIVE METABOLIC PANEL - Abnormal; Notable for the following:    Potassium 3.1 (*)    CO2 19 (*)    Glucose, Bld 111 (*)    BUN <5 (*)    ALT 9 (*)    All other components within normal limits  CBC WITH DIFFERENTIAL/PLATELET - Abnormal; Notable for the following:    WBC 3.3 (*)    Hemoglobin 9.5 (*)    HCT 28.6 (*)    MCV 72.6 (*)    MCH 24.1 (*)    RDW 16.8 (*)    Platelets 117 (*)    Lymphs Abs 0.6 (*)    All other components within normal limits  BASIC METABOLIC PANEL - Abnormal; Notable for the following:    Potassium 3.1 (*)    CO2 21 (*)    Calcium 8.4 (*)    All other components within normal limits  CBC - Abnormal; Notable for the following:    RBC 3.47 (*)    Hemoglobin 8.1 (*)    HCT 25.8 (*)    MCV 74.4 (*)    MCH 23.3 (*)    RDW 17.0 (*)    Platelets 116 (*)    All other components within normal limits  LIPASE, BLOOD  URINE RAPID DRUG SCREEN, HOSP PERFORMED  MAGNESIUM  CBC WITH DIFFERENTIAL/PLATELET  PREGNANCY, URINE  POC URINE PREG, ED    Imaging Review Ct Head Wo Contrast  01/08/2015  CLINICAL DATA:  Seizure with syncope.  Transient amnesia. EXAM: CT HEAD WITHOUT CONTRAST TECHNIQUE: Contiguous axial images were obtained from the base of the skull through the vertex without intravenous contrast. COMPARISON:  None. FINDINGS: The ventricles are normal in size and configuration. There is no intracranial mass, hemorrhage, extra-axial fluid collection, or midline shift. The gray-white compartments are normal. No acute infarct evident. The bony calvarium appears intact. The mastoid air cells are clear. There is  sphenoid sinus disease posteriorly. IMPRESSION: Sphenoid sinus disease. Study otherwise unremarkable. No intracranial mass, hemorrhage, or focal gray -white compartment lesions/ acute appearing infarct. Electronically Signed   By: Lowella Grip III M.D.   On: 01/08/2015 16:36   Mr Brain Wo Contrast  01/08/2015  CLINICAL DATA:  Seizure. Episode of loss of consciousness with seizure-like activity. Personal history of supraventricular tachycardia. EXAM: MRI HEAD WITHOUT CONTRAST TECHNIQUE: Multiplanar, multiecho pulse sequences of the brain and surrounding structures were obtained without intravenous contrast. COMPARISON:  CT head without contrast from the same day. FINDINGS: No acute infarct, hemorrhage, or mass lesion is present. The ventricles are of normal size. No significant extraaxial fluid collection is present. No significant white matter disease is present. The internal auditory canals are within normal limits. The brainstem is  normal. Flow is present in the major intracranial arteries. Globes and orbits are intact. Mucosal thickening and fluid is present in the sphenoid sinuses bilaterally. Minimal mucosal thickening is present in the anterior ethmoid air cells. The remaining paranasal sinuses are clear. Dedicated imaging of the temporal lobes demonstrate symmetric size and signal of the hippocampal structures bilaterally. IMPRESSION: 1. Normal MRI appearance of the brain. No acute or focal lesion to explain seizure. 2. Bilateral sphenoid sinus disease. Electronically Signed   By: San Morelle M.D.   On: 01/08/2015 19:13   Ct Abdomen Pelvis W Contrast  01/07/2015  CLINICAL DATA:  Periumbilical pain for 3 days. Severe vomiting. Patient has a history of bowel obstruction. EXAM: CT ABDOMEN AND PELVIS WITH CONTRAST TECHNIQUE: Multidetector CT imaging of the abdomen and pelvis was performed using the standard protocol following bolus administration of intravenous contrast. CONTRAST:  124mL  OMNIPAQUE IOHEXOL 300 MG/ML  SOLN COMPARISON:  06/06/2009 FINDINGS: The lung bases are unremarkable. The spleen, adrenals, pancreas, kidneys are unremarkable. There is a subtle decreased attenuation with geographic appearance along the falciform ligament of the liver. Gallbladder is folded, but otherwise normal in appearance. There is no evidence of bowel obstruction, enteritis, colitis, diverticulitis, nor appendicitis. Prior small bowel resection is noted in the mid abdomen. Small outpouching measuring 2.4 cm is seen at the enterotomy line. There is no evidence of appendicitis. The colon is decompressed. There is no evidence of abdominal aortic aneurysm. The celiac, SMA, IMA, portal vein, SMV are opacified. There is no evidence of abdominal or pelvic free fluid, loculated fluid collections, masses, nor adenopathy scant mural calcifications identified within the aorta and mesenteric vessels. The uterus is enlarged and globular containing multiple myometrial masses. There is a mostly exophytic myometrial mass off of the left uterine fundus extends well into the upper abdomen which measures 9.2 by 6.7 by 8.8 cm. It has significantly increased in size when compared to the prior study. There are bilateral ovarian cysts, the larger cyst on the right ovary measures 4 cm. There is no evidence of abdominal wall or inguinal hernia. There is no evidence of aggressive appearing osseous lesions. IMPRESSION: Subtle hypoattenuated geographic area adjacent to the falciform ligament of the liver, which may represent focal fatty infiltration. Significant pathology such as soft tissue mass, or liver abscess is considered less likely. However, if there is any clinical suspicion for liver pathology, MRI of the abdomen may be obtained for further evaluation. Status post small bowel resection, without evidence of small-bowel obstruction. Large leiomyomatous uterus. There is a mostly exophytic 9.2 cm in largest dimension myometrial mass  off of the left fundus of the uterus which demonstrates significant enlargement, when compared to the prior study. Ob-GYN consult may be considered. Bilateral ovarian cysts, likely benign in a premenopausal patient. Electronically Signed   By: Fidela Salisbury M.D.   On: 01/07/2015 11:43   US Abdomen Limited Ruq  01/08/2015  CLINICAL DATA:  Right upper abdominal pain, nausea, vomiting x4 days EXAM: US ABDOMEN LIMITED - RIGHT UPPER QUADRANT COMPARISON:  CT 01/07/2015 FINDINGS: Gallbladder: Small echogenic foci within the lumen, without definite shadowing. No gallbladder wall thickening or pericholecystic fluid. Sonographer reports no sonographic Murphy's sign. Common bile duct: Diameter: 5.9 mm, unremarkable Liver: No focal lesion identified. Within normal limits in parenchymal echogenicity. IMPRESSION: 1. Suspect small mobile gallbladder calculi. No ultrasound evidence of cholecystitis or biliary obstruction. Electronically Signed   By: Lucrezia Europe M.D.   On: 01/08/2015 15:06   I have personally  reviewed and evaluated these images and lab results as part of my medical decision-making.   EKG Interpretation   Date/Time:  Wednesday January 08 2015 13:12:08 EDT Ventricular Rate:  65 PR Interval:  127 QRS Duration: 78 QT Interval:  473 QTC Calculation: 492 R Axis:   73 Text Interpretation:  Sinus rhythm Borderline prolonged QT interval  Confirmed by Laural Eiland MD, Quillian Quince ([id number]) on 01/08/2015 3:00:09 PM      MDM   Final diagnoses:  RUQ abdominal pain  Prolonged Q-T interval on ECG  Syncope, unspecified syncope type    49 yo F with a chief complaint of syncope versus seizure activity. Patient with prolonged QT on EMS EKG. Repeated EKG here with a QTC of 490. Concern with multiple QT prolonging agents being started SAME time as cause of her episode. Patient with no history of seizures previously. Lab results otherwise unremarkable. Patient had continued epigastric tenderness) ultrasound  concerning for small gallbladder calculi. No signs of acute cholecystitis.  Patient awaiting head CT. CBC clotted in the lab.  Patient back to baseline. Continue to have nausea. Discussed concern of multiple QT prolonging medicines and need for overnight observation patient agrees.  The patients results and plan were reviewed and discussed.   Any x-rays performed were independently reviewed by myself.   Differential diagnosis were considered with the presenting HPI.  Medications  HYDROcodone-acetaminophen (NORCO/VICODIN) 5-325 MG per tablet 1-2 tablet (2 tablets Oral Given 01/09/15 0531)  heparin injection 5,000 Units (5,000 Units Subcutaneous Given 01/09/15 0531)  0.9 %  sodium chloride infusion ( Intravenous New Bag/Given 01/08/15 2006)  acetaminophen (TYLENOL) tablet 650 mg (not administered)    Or  acetaminophen (TYLENOL) suppository 650 mg (not administered)  polyethylene glycol (MIRALAX / GLYCOLAX) packet 17 g (not administered)  bisacodyl (DULCOLAX) suppository 10 mg (not administered)  ondansetron (ZOFRAN) tablet 4 mg (not administered)    Or  ondansetron (ZOFRAN) injection 4 mg (not administered)  sodium chloride 0.9 % injection 3 mL (3 mLs Intravenous Given 01/08/15 2239)  ondansetron (ZOFRAN) injection 4 mg (4 mg Intravenous Given 01/09/15 0740)  potassium chloride (K-DUR,KLOR-CON) CR tablet 40 mEq (not administered)  sodium chloride 0.9 % bolus 1,000 mL (0 mLs Intravenous Stopped 01/08/15 1548)  alum & mag hydroxide-simeth (MAALOX/MYLANTA) 200-200-20 MG/5ML suspension 15 mL (15 mLs Oral Given 01/08/15 1451)  lidocaine (XYLOCAINE) 2 % viscous mouth solution 15 mL (15 mLs Mouth/Throat Given 01/08/15 1450)  ketorolac (TORADOL) 30 MG/ML injection 30 mg (30 mg Intravenous Given 01/08/15 1451)  morphine 4 MG/ML injection 4 mg (4 mg Intravenous Given 01/08/15 1451)  LORazepam (ATIVAN) injection 1 mg (1 mg Intravenous Given 01/08/15 1604)  promethazine (PHENERGAN) injection 12.5 mg  (12.5 mg Intravenous Given 01/08/15 2156)  potassium chloride 20 MEQ/15ML (10%) solution 40 mEq (40 mEq Oral Given 01/09/15 0630)    Filed Vitals:   01/09/15 0209 01/09/15 0427 01/09/15 0600 01/09/15 0742  BP: 100/56 99/49 119/67   Pulse:  60 56   Temp: 99 F (37.2 C) 99.2 F (37.3 C)  98.7 F (37.1 C)  TempSrc: Oral Oral  Oral  Resp: 14 13 14    Height:      Weight:  157 lb 12.8 oz (71.578 kg)    SpO2:  96% 98%     Final diagnoses:  RUQ abdominal pain  Prolonged Q-T interval on ECG  Syncope, unspecified syncope type    Admission/ observation were discussed with the admitting physician, patient and/or family and they are comfortable with the plan.  Deno Etienne, DO 01/09/15 1105

## 2015-01-08 NOTE — ED Notes (Signed)
Attempted report 

## 2015-01-08 NOTE — ED Notes (Signed)
Admitting Md at the bedside 

## 2015-01-08 NOTE — ED Notes (Signed)
MD at the bedside  

## 2015-01-08 NOTE — H&P (Signed)
Patient Demographics  Alicia Fleming, is a 49 y.o. female  MRN: [id number]   DOB - [birth year]  Admit Date - 01/08/2015  Outpatient Primary MD for the patient is Merrilee Seashore, MD   With History of -  Past Medical History  Diagnosis Date  . Arthritis   . Rheumatoid arthritis(714.0)   . Other specified cardiac dysrhythmias(427.89)     SVT  . Allergy   . Asthma       Past Surgical History  Procedure Laterality Date  . 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 Small intestine surgery    . Supraventricular tachycardia ablation N/A 06/30/2012    Procedure: SUPRAVENTRICULAR TACHYCARDIA ABLATION;  Surgeon: Evans Lance, MD;  Location: [facility] CATH LAB;  Service: Cardiovascular;  Laterality: N/A;    in for   Chief Complaint  Patient presents with  . Seizures     HPI  Alicia Fleming  is a 49 y.o. female, past medical history of SVT, presents with an episode of loss of consciousness, with seizure-like activity, patient was in ED yesterday complaining of abdominal pain, nausea and vomiting, known history of small bowel obstruction, CT abdomen pelvis done with no evidence of small bowel obstruction, discharged home on Zofran, Protonix and Norco, he called her PCP this a.m., was started on ciprofloxacin, and Phenergan, after patient took these medications, she had an episode of loss of consciousness, was witnessed by family, patient reports palpitation preceding this episode ( both her previous SVTprior to  ablation) , had generalized body shaking lasted for 10 minutes, unresponsive during that episode, but no tongue biting, no urinary or stool incontinence, responded to EMS upon presentation, NAD abdomen ultrasound was significant for small mobile calculi in gallbladder, CT head with no acute finding, mildly prolonged QTC at 497, hospitalist requested to admit for further evaluation.   Review of Systems    In addition to the HPI above,  No  Fever-chills, No Headache, No changes with Vision or hearing, No problems swallowing food or Liquids, No Chest pain, Cough or Shortness of Breath, Reports abdominal pain, nausea and vomiting, hair Bowel movements are regular, No Blood in stool or Urine, No dysuria, No new skin rashes or bruises, No new joints pains-aches,  No new weakness, tingling, numbness in any extremity, Significant weight loss over last few month 10-15 pounds. No polyuria, polydypsia or polyphagia, Reports significant stressors at work  A full 10 point Review of Systems was done, except as stated above, all other Review of Systems were negative.   Social History Social History  Substance Use Topics  . Smoking status: Never Smoker   . Smokeless tobacco: Not on file  . Alcohol Use: Yes     Comment: occasionally     Family History Family History  Problem Relation Age of Onset  . Hypertension Mother   . Hypertension Father   . Stroke Maternal Grandmother   . Stroke Maternal Grandfather   . Heart disease Paternal Grandfather      Prior to Admission medications   Medication Sig Start Date End Date Taking? Authorizing Provider  HYDROcodone-acetaminophen (NORCO/VICODIN) 5-325 MG tablet Take 1-2 tablets by mouth every 4 (four) hours as needed. 01/07/15  Yes Delos Haring, PA-C    No Known Allergies  Physical Exam  Vitals  Blood pressure 125/64, pulse 51, resp. rate 17, last menstrual period 12/11/2014, SpO2 100 %.   1. General Young female lying in bed in NAD,  2. Normal affect and insight, Not Suicidal or Homicidal, Awake Alert, Oriented X 3.  3. No F.N deficits, ALL C.Nerves Intact, Strength 5/5 all 4 extremities, Sensation intact all 4 extremities, Plantars down going.  4. Ears and Eyes appear Normal, Conjunctivae clear, PERRLA. Moist Oral Mucosa.  5. Supple Neck, No JVD, No cervical lymphadenopathy appriciated, No Carotid Bruits.  6. Symmetrical Chest wall movement, Good air movement  bilaterally, CTAB.  7. RRR, No Gallops, Rubs or Murmurs, No Parasternal Heave.  8. Positive Bowel Sounds, Abdomen Soft, mild tenderness in epigastric area, No organomegaly appriciated,No rebound -guarding or rigidity.  9.  No Cyanosis, Normal Skin Turgor, No Skin Rash or Bruise.  10. Good muscle tone,  joints appear normal , no effusions, Normal ROM.     Data Review  CBC  Recent Labs Lab 01/07/15 1005 01/08/15 1549  WBC 4.8 3.3*  HGB 9.8* 9.5*  HCT 30.0* 28.6*  PLT 152 117*  MCV 73.3* 72.6*  MCH 24.0* 24.1*  MCHC 32.7 33.2  RDW 16.7* 16.8*  LYMPHSABS  --  0.6*  MONOABS  --  0.2  EOSABS  --  0.0  BASOSABS  --  0.0   ------------------------------------------------------------------------------------------------------------------  Chemistries   Recent Labs Lab 01/07/15 1005 01/08/15 1340  NA 132* 136  K 3.7 3.1*  CL 102 106  CO2 21* 19*  GLUCOSE 136* 111*  BUN <5* <5*  CREATININE 0.59 0.75  CALCIUM 9.3 9.4  AST 18 19  ALT 10* 9*  ALKPHOS 61 60  BILITOT 0.5 0.3   ------------------------------------------------------------------------------------------------------------------ CrCl cannot be calculated (Unknown ideal weight.). ------------------------------------------------------------------------------------------------------------------ No results for input(s): TSH, T4TOTAL, T3FREE, THYROIDAB in the last 72 hours.  Invalid input(s): FREET3   Coagulation profile No results for input(s): INR, PROTIME in the last 168 hours. ------------------------------------------------------------------------------------------------------------------- No results for input(s): DDIMER in the last 72 hours. -------------------------------------------------------------------------------------------------------------------  Cardiac Enzymes No results for input(s): CKMB, TROPONINI, MYOGLOBIN in the last 168 hours.  Invalid input(s):  CK ------------------------------------------------------------------------------------------------------------------ Invalid input(s): POCBNP   ---------------------------------------------------------------------------------------------------------------  Urinalysis    Component Value Date/Time   COLORURINE YELLOW 01/07/2015 1010   APPEARANCEUR CLEAR 01/07/2015 1010   LABSPEC >1.046* 01/07/2015 1010   PHURINE 6.5 01/07/2015 1010   GLUCOSEU NEGATIVE 01/07/2015 1010   HGBUR NEGATIVE 01/07/2015 1010   BILIRUBINUR NEGATIVE 01/07/2015 1010   KETONESUR >80* 01/07/2015 1010   PROTEINUR NEGATIVE 01/07/2015 1010   UROBILINOGEN 0.2 01/07/2015 1010   NITRITE NEGATIVE 01/07/2015 1010   LEUKOCYTESUR NEGATIVE 01/07/2015 1010    ----------------------------------------------------------------------------------------------------------------  Imaging results:   Ct Head Wo Contrast  01/08/2015  CLINICAL DATA:  Seizure with syncope.  Transient amnesia. EXAM: CT HEAD WITHOUT CONTRAST TECHNIQUE: Contiguous axial images were obtained from the base of the skull through the vertex without intravenous contrast. COMPARISON:  None. FINDINGS: The ventricles are normal in size and configuration. There is no intracranial mass, hemorrhage, extra-axial fluid collection, or midline shift. The gray-white compartments are normal. No acute infarct evident. The bony calvarium appears intact. The mastoid air cells are clear. There is sphenoid sinus disease posteriorly. IMPRESSION: Sphenoid sinus disease. Study otherwise unremarkable. No intracranial mass, hemorrhage, or focal gray -white compartment lesions/ acute appearing infarct. Electronically Signed   By: Lowella Grip III M.D.   On: 01/08/2015 16:36   Ct Abdomen Pelvis W Contrast  01/07/2015  CLINICAL DATA:  Periumbilical pain for 3 days. Severe vomiting. Patient has a history of bowel obstruction. EXAM: CT ABDOMEN AND PELVIS WITH CONTRAST TECHNIQUE:  Multidetector CT imaging of the  abdomen and pelvis was performed using the standard protocol following bolus administration of intravenous contrast. CONTRAST:  158mL OMNIPAQUE IOHEXOL 300 MG/ML  SOLN COMPARISON:  06/06/2009 FINDINGS: The lung bases are unremarkable. The spleen, adrenals, pancreas, kidneys are unremarkable. There is a subtle decreased attenuation with geographic appearance along the falciform ligament of the liver. Gallbladder is folded, but otherwise normal in appearance. There is no evidence of bowel obstruction, enteritis, colitis, diverticulitis, nor appendicitis. Prior small bowel resection is noted in the mid abdomen. Small outpouching measuring 2.4 cm is seen at the enterotomy line. There is no evidence of appendicitis. The colon is decompressed. There is no evidence of abdominal aortic aneurysm. The celiac, SMA, IMA, portal vein, SMV are opacified. There is no evidence of abdominal or pelvic free fluid, loculated fluid collections, masses, nor adenopathy scant mural calcifications identified within the aorta and mesenteric vessels. The uterus is enlarged and globular containing multiple myometrial masses. There is a mostly exophytic myometrial mass off of the left uterine fundus extends well into the upper abdomen which measures 9.2 by 6.7 by 8.8 cm. It has significantly increased in size when compared to the prior study. There are bilateral ovarian cysts, the larger cyst on the right ovary measures 4 cm. There is no evidence of abdominal wall or inguinal hernia. There is no evidence of aggressive appearing osseous lesions. IMPRESSION: Subtle hypoattenuated geographic area adjacent to the falciform ligament of the liver, which may represent focal fatty infiltration. Significant pathology such as soft tissue mass, or liver abscess is considered less likely. However, if there is any clinical suspicion for liver pathology, MRI of the abdomen may be obtained for further evaluation. Status post  small bowel resection, without evidence of small-bowel obstruction. Large leiomyomatous uterus. There is a mostly exophytic 9.2 cm in largest dimension myometrial mass off of the left fundus of the uterus which demonstrates significant enlargement, when compared to the prior study. Ob-GYN consult may be considered. Bilateral ovarian cysts, likely benign in a premenopausal patient. Electronically Signed   By: Fidela Salisbury M.D.   On: 01/07/2015 11:43   US Abdomen Limited Ruq  01/08/2015  CLINICAL DATA:  Right upper abdominal pain, nausea, vomiting x4 days EXAM: US ABDOMEN LIMITED - RIGHT UPPER QUADRANT COMPARISON:  CT 01/07/2015 FINDINGS: Gallbladder: Small echogenic foci within the lumen, without definite shadowing. No gallbladder wall thickening or pericholecystic fluid. Sonographer reports no sonographic Murphy's sign. Common bile duct: Diameter: 5.9 mm, unremarkable Liver: No focal lesion identified. Within normal limits in parenchymal echogenicity. IMPRESSION: 1. Suspect small mobile gallbladder calculi. No ultrasound evidence of cholecystitis or biliary obstruction. Electronically Signed   By: Lucrezia Europe M.D.   On: 01/08/2015 15:06    My personal review of EKG: Rhythm NSR, Rate 65  /min, QTc  492, no Acute ST changes    Assessment & Plan  Active Problems:   Prolonged Q-T interval on ECG   Syncope   Nausea  Syncope versus seizures - Patient's clinical presentation is questionable seizure versus syncope - Has known history of SVT in the past status post catheter ablation by Dr. Lovena Le, will be admitted to telemetry, will check 2-D echo, diffusion were consulted, will see patient in a.m.Marland Kitchen - Seizures is a strong possibility as well,  especially she was started on Cipro which lower seizure threshold, has generalized shaking activity, even though there is no tongue biting or urinary or stool incontinence, will obtain EEG, was to the neurology consult, CT head with no acute finding, will  obtain MRI brain without contrast as per discussion with neurology. - Continue with IV fluids  Nausea ,vomiting and abdominal pain - CT abdomen pelvis done yesterday with no acute findings - Ultrasound significant for small mobile calculi, symptoms may be related to cholelithiasis, as well possible gastritis/gastroenteritis, continue with IV fluids. - Will keep on when necessary and Zofran but will monitor closely given her borderline QTc.  Prolonged QTC - Borderline prolonged, will repeat EKG in a.m., continue to monitor on telemetry, will DC Zofran if gets any longer.  Loss of appetite, weight loss and depressed mood. - Patient with depression, this should be evaluated when patient is back to baseline.  DVT Prophylaxis Heparin -   AM Labs Ordered, also please review Full Orders  Family Communication: Admission, patients condition and plan of care including tests being ordered have been discussed with the patient and family at bedside who indicate understanding and agree with the plan and Code Status.  Code Status Full  Likely DC to  pending further workup  Condition GUARDED   Time spent in minutes : 55 minutes    ELGERGAWY, DAWOOD M.D on 01/08/2015 at 5:18 PM  Between 7am to 7pm - Pager - [phone]  After 7pm go to [email or web address] - password [email or web address]  And look for the night coverage person covering me after hours  [facility] Office  [phone]

## 2015-01-08 NOTE — ED Notes (Signed)
Randall Hiss, RN given report and took patient to Pod C.

## 2015-01-08 NOTE — ED Notes (Signed)
Pt presents with possible new onset seizure activity. Pt. Had syncopal episode in bathroom, witnessed by friend. Friend states pt. Had shaking to extremities x4 for approx 10 min. EMS reports that they noticed muscle tremors, but pt. Was able to respond to EMS on arrival. Pt. Was seen recently for gastritis. Pt. CBG 86, BP 117/68, HR 60 NSR.

## 2015-01-08 NOTE — ED Notes (Signed)
Neurologist at bedside. 

## 2015-01-09 ENCOUNTER — Observation Stay (HOSPITAL_BASED_OUTPATIENT_CLINIC_OR_DEPARTMENT_OTHER): Payer: 59

## 2015-01-09 ENCOUNTER — Encounter (HOSPITAL_COMMUNITY): Payer: Self-pay | Admitting: Nurse Practitioner

## 2015-01-09 ENCOUNTER — Observation Stay (HOSPITAL_COMMUNITY): Payer: 59

## 2015-01-09 DIAGNOSIS — R569 Unspecified convulsions: Secondary | ICD-10-CM | POA: Insufficient documentation

## 2015-01-09 DIAGNOSIS — R55 Syncope and collapse: Secondary | ICD-10-CM

## 2015-01-09 DIAGNOSIS — R110 Nausea: Secondary | ICD-10-CM

## 2015-01-09 DIAGNOSIS — E440 Moderate protein-calorie malnutrition: Secondary | ICD-10-CM | POA: Diagnosis not present

## 2015-01-09 DIAGNOSIS — M069 Rheumatoid arthritis, unspecified: Secondary | ICD-10-CM | POA: Diagnosis not present

## 2015-01-09 DIAGNOSIS — R1011 Right upper quadrant pain: Secondary | ICD-10-CM | POA: Insufficient documentation

## 2015-01-09 DIAGNOSIS — I4581 Long QT syndrome: Secondary | ICD-10-CM | POA: Diagnosis not present

## 2015-01-09 DIAGNOSIS — J45909 Unspecified asthma, uncomplicated: Secondary | ICD-10-CM | POA: Diagnosis not present

## 2015-01-09 LAB — BASIC METABOLIC PANEL
Anion gap: 7 (ref 5–15)
BUN: 7 mg/dL (ref 6–20)
CHLORIDE: 107 mmol/L (ref 101–111)
CO2: 21 mmol/L — AB (ref 22–32)
CREATININE: 0.67 mg/dL (ref 0.44–1.00)
Calcium: 8.4 mg/dL — ABNORMAL LOW (ref 8.9–10.3)
GFR calc non Af Amer: >60 mL/min (ref >60)
Glucose, Bld: 92 mg/dL (ref 65–99)
POTASSIUM: 3.1 mmol/L — AB (ref 3.5–5.1)
Sodium: 135 mmol/L (ref 135–145)

## 2015-01-09 LAB — CBC
HEMATOCRIT: 25.8 % — AB (ref 36.0–46.0)
HEMOGLOBIN: 8.1 g/dL — AB (ref 12.0–15.0)
MCH: 23.3 pg — AB (ref 26.0–34.0)
MCHC: 31.4 g/dL (ref 30.0–36.0)
MCV: 74.4 fL — AB (ref 78.0–100.0)
PLATELETS: 116 10*3/uL — AB (ref 150–400)
RBC: 3.47 MIL/uL — AB (ref 3.87–5.11)
RDW: 17 % — ABNORMAL HIGH (ref 11.5–15.5)
WBC: 4.6 10*3/uL (ref 4.0–10.5)

## 2015-01-09 LAB — MAGNESIUM: Magnesium: 1.8 mg/dL (ref 1.7–2.4)

## 2015-01-09 LAB — TSH: TSH: 2.322 u[IU]/mL (ref 0.350–4.500)

## 2015-01-09 MED ORDER — POTASSIUM CHLORIDE CRYS ER 10 MEQ PO TBCR
40.0000 meq | EXTENDED_RELEASE_TABLET | Freq: Once | ORAL | Status: AC
  Administered 2015-01-09: 40 meq via ORAL
  Filled 2015-01-09: qty 4

## 2015-01-09 MED ORDER — POTASSIUM CHLORIDE CRYS ER 20 MEQ PO TBCR: 40.0000 meq | EXTENDED_RELEASE_TABLET | Freq: Once | ORAL | Status: DC

## 2015-01-09 MED ORDER — POTASSIUM CHLORIDE 20 MEQ/15ML (10%) PO SOLN
40.0000 meq | Freq: Once | ORAL | Status: AC
  Administered 2015-01-09: 40 meq via ORAL
  Filled 2015-01-09: qty 30

## 2015-01-09 MED ORDER — POTASSIUM CHLORIDE 20 MEQ/15ML (10%) PO SOLN: 40.0000 meq | Freq: Once | ORAL | Status: DC

## 2015-01-09 NOTE — Progress Notes (Signed)
Initial Nutrition Assessment  DOCUMENTATION CODES:   Non-severe (moderate) malnutrition in context of acute illness/injury  INTERVENTION:    Boost Breeze PO TID, each supplement provides 250 kcal and 9 grams of protein  NUTRITION DIAGNOSIS:   Malnutrition related to acute illness as evidenced by energy intake < or equal to 50% for > or equal to 5 days, percent weight loss (7.5% weight loss within the past 3 months).  GOAL:   Patient will meet greater than or equal to 90% of their needs  MONITOR:   PO intake, Supplement acceptance, I & O's  REASON FOR ASSESSMENT:   Malnutrition Screening Tool    ASSESSMENT:   49 y.o. female, past medical history of SVT, presents with an episode of loss of consciousness, with seizure-like activity.  During RD visit, patient almost in tears, c/o abd pain with emesis basin on bed, face  partially covered with her arms. She reports that she has been eating poorly for the past 2-3 months due to poor appetite and abd pain. She has been eating less than half of her usual intake at home. Nutrition focused physical exam completed.  No muscle or subcutaneous fat depletion noticed. Patient agreed to try Breeze PO supplements to maximize oral intake.  Diet Order:  Diet heart healthy/carb modified Room service appropriate?: Yes; Fluid consistency:: Thin  Skin:  Reviewed, no issues  Last BM:  10/11  Height:   Ht Readings from Last 1 Encounters:  01/08/15 5\' 7"  (1.702 m)    Weight:   Wt Readings from Last 1 Encounters:  01/09/15 157 lb 12.8 oz (71.578 kg)    Ideal Body Weight:  61.4 kg  BMI:  Body mass index is 24.71 kg/(m^2).  Estimated Nutritional Needs:   Kcal:  1800-2000  Protein:  85-100 gm  Fluid:  1.8-2 L  EDUCATION NEEDS:   No education needs identified at this time  Molli Barrows, [place], [place], [place] Pager [phone] After Hours Pager [phone]

## 2015-01-09 NOTE — Progress Notes (Signed)
Subjective: No overnight events. No further seizure activity. Reports continued GI discomfort.   Objective: Current vital signs: BP 119/67 mmHg  Pulse 56  Temp(Src) 98.7 F (37.1 C) (Oral)  Resp 14  Ht 5\' 7"  (1.702 m)  Wt 71.578 kg (157 lb 12.8 oz)  BMI 24.71 kg/m2  SpO2 98%  LMP 12/11/2014 Vital signs in last 24 hours: Temp:  [98 F (36.7 C)-99.7 F (37.6 C)] 98.7 F (37.1 C) (10/13 0742) Pulse Rate:  [47-69] 56 (10/13 0600) Resp:  [12-23] 14 (10/13 0600) BP: (99-125)/(49-67) 119/67 mmHg (10/13 0600) SpO2:  [96 %-100 %] 98 % (10/13 0600) Weight:  [68.901 kg (151 lb 14.4 oz)-71.578 kg (157 lb 12.8 oz)] 71.578 kg (157 lb 12.8 oz) (10/13 0427)  Intake/Output from previous day: 10/12 0701 - 10/13 0700 In: -  Out: 200 [Urine:200] Intake/Output this shift:   Nutritional status: Diet heart healthy/carb modified Room service appropriate?: Yes; Fluid consistency:: Thin  Neurologic Exam: Mental Status: Patient is awake, alert, oriented to person, place, month, year, and situation. Patient is able to give a clear and coherent history. No signs of aphasia or neglect Cranial Nerves: II: fixates and tracks. Pupils are equal, round, and reactive to light.  III,IV, VI: EOMI without ptosis or diploplia.  V: Facial sensation is symmetric to temperature VII: Facial movement is symmetric.  Motor: Tone is normal. Bulk is normal. 5/5 strength was present in all four extremities.  Sensory: Sensation is symmetric to light touch and temperature in the arms and legs.  Lab Results: Basic Metabolic Panel:  Recent Labs Lab 01/07/15 1005 01/08/15 1340 01/09/15 0425  NA 132* 136 135  K 3.7 3.1* 3.1*  CL 102 106 107  CO2 21* 19* 21*  GLUCOSE 136* 111* 92  BUN <5* <5* 7  CREATININE 0.59 0.75 0.67  CALCIUM 9.3 9.4 8.4*    Liver Function Tests:  Recent Labs Lab 01/07/15 1005 01/08/15 1340  AST 18 19  ALT 10* 9*  ALKPHOS 61 60  BILITOT 0.5 0.3  PROT 7.7 7.9  ALBUMIN 3.5  3.5    Recent Labs Lab 01/07/15 1005 01/08/15 1340  LIPASE 24 31   No results for input(s): AMMONIA in the last 168 hours.  CBC:  Recent Labs Lab 01/07/15 1005 01/08/15 1549 01/09/15 0425  WBC 4.8 3.3* 4.6  NEUTROABS  --  2.5  --   HGB 9.8* 9.5* 8.1*  HCT 30.0* 28.6* 25.8*  MCV 73.3* 72.6* 74.4*  PLT 152 117* 116*    Cardiac Enzymes: No results for input(s): CKTOTAL, CKMB, CKMBINDEX, TROPONINI in the last 168 hours.  Lipid Panel: No results for input(s): CHOL, TRIG, HDL, CHOLHDL, VLDL, LDLCALC in the last 168 hours.  CBG: No results for input(s): GLUCAP in the last 168 hours.  Microbiology: No results found for this or any previous visit.  Coagulation Studies: No results for input(s): LABPROT, INR in the last 72 hours.  Imaging: Ct Head Wo Contrast  01/08/2015  CLINICAL DATA:  Seizure with syncope.  Transient amnesia. EXAM: CT HEAD WITHOUT CONTRAST TECHNIQUE: Contiguous axial images were obtained from the base of the skull through the vertex without intravenous contrast. COMPARISON:  None. FINDINGS: The ventricles are normal in size and configuration. There is no intracranial mass, hemorrhage, extra-axial fluid collection, or midline shift. The gray-white compartments are normal. No acute infarct evident. The bony calvarium appears intact. The mastoid air cells are clear. There is sphenoid sinus disease posteriorly. IMPRESSION: Sphenoid sinus disease. Study otherwise unremarkable. No  intracranial mass, hemorrhage, or focal gray -white compartment lesions/ acute appearing infarct. Electronically Signed   By: Lowella Grip III M.D.   On: 01/08/2015 16:36   Mr Brain Wo Contrast  01/08/2015  CLINICAL DATA:  Seizure. Episode of loss of consciousness with seizure-like activity. Personal history of supraventricular tachycardia. EXAM: MRI HEAD WITHOUT CONTRAST TECHNIQUE: Multiplanar, multiecho pulse sequences of the brain and surrounding structures were obtained without  intravenous contrast. COMPARISON:  CT head without contrast from the same day. FINDINGS: No acute infarct, hemorrhage, or mass lesion is present. The ventricles are of normal size. No significant extraaxial fluid collection is present. No significant white matter disease is present. The internal auditory canals are within normal limits. The brainstem is normal. Flow is present in the major intracranial arteries. Globes and orbits are intact. Mucosal thickening and fluid is present in the sphenoid sinuses bilaterally. Minimal mucosal thickening is present in the anterior ethmoid air cells. The remaining paranasal sinuses are clear. Dedicated imaging of the temporal lobes demonstrate symmetric size and signal of the hippocampal structures bilaterally. IMPRESSION: 1. Normal MRI appearance of the brain. No acute or focal lesion to explain seizure. 2. Bilateral sphenoid sinus disease. Electronically Signed   By: San Morelle M.D.   On: 01/08/2015 19:13   Ct Abdomen Pelvis W Contrast  01/07/2015  CLINICAL DATA:  Periumbilical pain for 3 days. Severe vomiting. Patient has a history of bowel obstruction. EXAM: CT ABDOMEN AND PELVIS WITH CONTRAST TECHNIQUE: Multidetector CT imaging of the abdomen and pelvis was performed using the standard protocol following bolus administration of intravenous contrast. CONTRAST:  172mL OMNIPAQUE IOHEXOL 300 MG/ML  SOLN COMPARISON:  06/06/2009 FINDINGS: The lung bases are unremarkable. The spleen, adrenals, pancreas, kidneys are unremarkable. There is a subtle decreased attenuation with geographic appearance along the falciform ligament of the liver. Gallbladder is folded, but otherwise normal in appearance. There is no evidence of bowel obstruction, enteritis, colitis, diverticulitis, nor appendicitis. Prior small bowel resection is noted in the mid abdomen. Small outpouching measuring 2.4 cm is seen at the enterotomy line. There is no evidence of appendicitis. The colon is  decompressed. There is no evidence of abdominal aortic aneurysm. The celiac, SMA, IMA, portal vein, SMV are opacified. There is no evidence of abdominal or pelvic free fluid, loculated fluid collections, masses, nor adenopathy scant mural calcifications identified within the aorta and mesenteric vessels. The uterus is enlarged and globular containing multiple myometrial masses. There is a mostly exophytic myometrial mass off of the left uterine fundus extends well into the upper abdomen which measures 9.2 by 6.7 by 8.8 cm. It has significantly increased in size when compared to the prior study. There are bilateral ovarian cysts, the larger cyst on the right ovary measures 4 cm. There is no evidence of abdominal wall or inguinal hernia. There is no evidence of aggressive appearing osseous lesions. IMPRESSION: Subtle hypoattenuated geographic area adjacent to the falciform ligament of the liver, which may represent focal fatty infiltration. Significant pathology such as soft tissue mass, or liver abscess is considered less likely. However, if there is any clinical suspicion for liver pathology, MRI of the abdomen may be obtained for further evaluation. Status post small bowel resection, without evidence of small-bowel obstruction. Large leiomyomatous uterus. There is a mostly exophytic 9.2 cm in largest dimension myometrial mass off of the left fundus of the uterus which demonstrates significant enlargement, when compared to the prior study. Ob-GYN consult may be considered. Bilateral ovarian cysts, likely benign in  a premenopausal patient. Electronically Signed   By: Fidela Salisbury M.D.   On: 01/07/2015 11:43   US Abdomen Limited Ruq  01/08/2015  CLINICAL DATA:  Right upper abdominal pain, nausea, vomiting x4 days EXAM: US ABDOMEN LIMITED - RIGHT UPPER QUADRANT COMPARISON:  CT 01/07/2015 FINDINGS: Gallbladder: Small echogenic foci within the lumen, without definite shadowing. No gallbladder wall thickening  or pericholecystic fluid. Sonographer reports no sonographic Murphy's sign. Common bile duct: Diameter: 5.9 mm, unremarkable Liver: No focal lesion identified. Within normal limits in parenchymal echogenicity. IMPRESSION: 1. Suspect small mobile gallbladder calculi. No ultrasound evidence of cholecystitis or biliary obstruction. Electronically Signed   By: Lucrezia Europe M.D.   On: 01/08/2015 15:06    Medications:  Scheduled: . heparin  5,000 Units Subcutaneous 3 times per day  . potassium chloride  40 mEq Oral Once  . sodium chloride  3 mL Intravenous Q12H    Assessment/Plan:  49 year old female with new onset seizure. Possibilities include lowering of seizure threshold by Cipro, though the degree to which Cipro lower seizure threshold is not completely clear. Also, seizures secondary to hypoperfusion due to arrhythmia given the prolonged QTC that was found on evaluation here in the setting of multiple QTC prolonging agents. Finally, unprovoked seizure would also be possible.  MRI brain imaging reviewed and overall unremarkable. Awaiting EEG. If EEG unremarkable would hold on starting AED at this time.  -check EEG -if EEG unremarkable no further neurological workup indicated.  -Patient is unable to drive, operate heavy machinery, perform activities at heights or participate in water activities until release by outpatient physician.      Jim Like, DO [facility] [phone]  If 7pm- 7am, please page neurology on call as listed in [place]. 01/09/2015  9:12 AM

## 2015-01-09 NOTE — Progress Notes (Signed)
[facility] PROGRESS NOTE   Alicia Fleming OZD:[id number] DOB: May 16, 1965 DOA: 01/08/2015 PCP: Merrilee Seashore, MD  HPI/Subjective: No specific symptoms since admission, denies any lightheadedness. Denies any chest pain or shortness of breath.  Assessment/Plan: Active Problems:   Prolonged Q-T interval on ECG   Syncope   Nausea   Syncope versus seizures -Patient's clinical presentation is questionable seizure versus syncope -Has known history of SVT in the past status post catheter ablation, check 2-D echo. -MRI showed no acute events, EEG results pending. -Seen by neurology and started on Keppra, this is her first episode of seizure-like activity. -Patient was not confused after she regained her consciousness (no postictal state). -I am leaning towards syncope, patient recent GI illness with dehydration/hypovolemia can predispose her to syncope.  Nausea ,vomiting and abdominal pain -CT abdomen pelvis done yesterday with no acute findings -Ultrasound significant for small mobile calculi, symptoms may be related to cholelithiasis, as well possible gastritis/gastroenteritis, continue with IV fluids. -Will keep on when necessary and Zofran but will monitor closely given her borderline QTc. -No evidence of acute cholecystitis is no fever or chills, no postprandial symptoms.  Prolonged QTC -Borderline prolonged, will repeat EKG in a.m., continue to monitor on telemetry.  Loss of appetite, weight loss and depressed mood. -Patient with depression, this should be evaluated when patient is back to baseline.  Code Status: Full Code Family Communication: Plan discussed with the patient. Disposition Plan: Remains inpatient Diet: Diet heart healthy/carb modified Room service appropriate?: Yes; Fluid consistency:: Thin  Consultants:  Cardiology.  Neurology  Procedures:  None  Antibiotics:  None   Objective: Filed Vitals:   01/09/15 0742  BP:   Pulse:   Temp:  98.7 F (37.1 C)  Resp:     Intake/Output Summary (Last 24 hours) at 01/09/15 1052 Last data filed at 01/09/15 0900  Gross per 24 hour  Intake    380 ml  Output    200 ml  Net    180 ml   Filed Weights   01/08/15 2000 01/09/15 0427  Weight: 68.901 kg (151 lb 14.4 oz) 71.578 kg (157 lb 12.8 oz)    Exam: General: Alert and awake, oriented x3, not in any acute distress. HEENT: anicteric sclera, pupils reactive to light and accommodation, EOMI CVS: S1-S2 clear, no murmur rubs or gallops Chest: clear to auscultation bilaterally, no wheezing, rales or rhonchi Abdomen: soft nontender, nondistended, normal bowel sounds, no organomegaly Extremities: no cyanosis, clubbing or edema noted bilaterally Neuro: Cranial nerves II-XII intact, no focal neurological deficits  Data Reviewed: Basic Metabolic Panel:  Recent Labs Lab 01/07/15 1005 01/08/15 1340 01/09/15 0425 01/09/15 0918  NA 132* 136 135  --   K 3.7 3.1* 3.1*  --   CL 102 106 107  --   CO2 21* 19* 21*  --   GLUCOSE 136* 111* 92  --   BUN <5* <5* 7  --   CREATININE 0.59 0.75 0.67  --   CALCIUM 9.3 9.4 8.4*  --   MG  --   --   --  1.8   Liver Function Tests:  Recent Labs Lab 01/07/15 1005 01/08/15 1340  AST 18 19  ALT 10* 9*  ALKPHOS 61 60  BILITOT 0.5 0.3  PROT 7.7 7.9  ALBUMIN 3.5 3.5    Recent Labs Lab 01/07/15 1005 01/08/15 1340  LIPASE 24 31   No results for input(s): AMMONIA in the last 168 hours. CBC:  Recent Labs Lab 01/07/15 1005 01/08/15 1549  01/09/15 0425  WBC 4.8 3.3* 4.6  NEUTROABS  --  2.5  --   HGB 9.8* 9.5* 8.1*  HCT 30.0* 28.6* 25.8*  MCV 73.3* 72.6* 74.4*  PLT 152 117* 116*   Cardiac Enzymes: No results for input(s): CKTOTAL, CKMB, CKMBINDEX, TROPONINI in the last 168 hours. BNP (last 3 results) No results for input(s): BNP in the last 8760 hours.  ProBNP (last 3 results) No results for input(s): PROBNP in the last 8760 hours.  CBG: No results for input(s): GLUCAP in  the last 168 hours.  Micro No results found for this or any previous visit (from the past 240 hour(s)).   Studies: Ct Head Wo Contrast  01/08/2015  CLINICAL DATA:  Seizure with syncope.  Transient amnesia. EXAM: CT HEAD WITHOUT CONTRAST TECHNIQUE: Contiguous axial images were obtained from the base of the skull through the vertex without intravenous contrast. COMPARISON:  None. FINDINGS: The ventricles are normal in size and configuration. There is no intracranial mass, hemorrhage, extra-axial fluid collection, or midline shift. The gray-white compartments are normal. No acute infarct evident. The bony calvarium appears intact. The mastoid air cells are clear. There is sphenoid sinus disease posteriorly. IMPRESSION: Sphenoid sinus disease. Study otherwise unremarkable. No intracranial mass, hemorrhage, or focal gray -white compartment lesions/ acute appearing infarct. Electronically Signed   By: Lowella Grip III M.D.   On: 01/08/2015 16:36   Mr Brain Wo Contrast  01/08/2015  CLINICAL DATA:  Seizure. Episode of loss of consciousness with seizure-like activity. Personal history of supraventricular tachycardia. EXAM: MRI HEAD WITHOUT CONTRAST TECHNIQUE: Multiplanar, multiecho pulse sequences of the brain and surrounding structures were obtained without intravenous contrast. COMPARISON:  CT head without contrast from the same day. FINDINGS: No acute infarct, hemorrhage, or mass lesion is present. The ventricles are of normal size. No significant extraaxial fluid collection is present. No significant white matter disease is present. The internal auditory canals are within normal limits. The brainstem is normal. Flow is present in the major intracranial arteries. Globes and orbits are intact. Mucosal thickening and fluid is present in the sphenoid sinuses bilaterally. Minimal mucosal thickening is present in the anterior ethmoid air cells. The remaining paranasal sinuses are clear. Dedicated imaging of  the temporal lobes demonstrate symmetric size and signal of the hippocampal structures bilaterally. IMPRESSION: 1. Normal MRI appearance of the brain. No acute or focal lesion to explain seizure. 2. Bilateral sphenoid sinus disease. Electronically Signed   By: San Morelle M.D.   On: 01/08/2015 19:13   Ct Abdomen Pelvis W Contrast  01/07/2015  CLINICAL DATA:  Periumbilical pain for 3 days. Severe vomiting. Patient has a history of bowel obstruction. EXAM: CT ABDOMEN AND PELVIS WITH CONTRAST TECHNIQUE: Multidetector CT imaging of the abdomen and pelvis was performed using the standard protocol following bolus administration of intravenous contrast. CONTRAST:  13mL OMNIPAQUE IOHEXOL 300 MG/ML  SOLN COMPARISON:  06/06/2009 FINDINGS: The lung bases are unremarkable. The spleen, adrenals, pancreas, kidneys are unremarkable. There is a subtle decreased attenuation with geographic appearance along the falciform ligament of the liver. Gallbladder is folded, but otherwise normal in appearance. There is no evidence of bowel obstruction, enteritis, colitis, diverticulitis, nor appendicitis. Prior small bowel resection is noted in the mid abdomen. Small outpouching measuring 2.4 cm is seen at the enterotomy line. There is no evidence of appendicitis. The colon is decompressed. There is no evidence of abdominal aortic aneurysm. The celiac, SMA, IMA, portal vein, SMV are opacified. There is no evidence of abdominal  or pelvic free fluid, loculated fluid collections, masses, nor adenopathy scant mural calcifications identified within the aorta and mesenteric vessels. The uterus is enlarged and globular containing multiple myometrial masses. There is a mostly exophytic myometrial mass off of the left uterine fundus extends well into the upper abdomen which measures 9.2 by 6.7 by 8.8 cm. It has significantly increased in size when compared to the prior study. There are bilateral ovarian cysts, the larger cyst on the  right ovary measures 4 cm. There is no evidence of abdominal wall or inguinal hernia. There is no evidence of aggressive appearing osseous lesions. IMPRESSION: Subtle hypoattenuated geographic area adjacent to the falciform ligament of the liver, which may represent focal fatty infiltration. Significant pathology such as soft tissue mass, or liver abscess is considered less likely. However, if there is any clinical suspicion for liver pathology, MRI of the abdomen may be obtained for further evaluation. Status post small bowel resection, without evidence of small-bowel obstruction. Large leiomyomatous uterus. There is a mostly exophytic 9.2 cm in largest dimension myometrial mass off of the left fundus of the uterus which demonstrates significant enlargement, when compared to the prior study. Ob-GYN consult may be considered. Bilateral ovarian cysts, likely benign in a premenopausal patient. Electronically Signed   By: Fidela Salisbury M.D.   On: 01/07/2015 11:43   US Abdomen Limited Ruq  01/08/2015  CLINICAL DATA:  Right upper abdominal pain, nausea, vomiting x4 days EXAM: US ABDOMEN LIMITED - RIGHT UPPER QUADRANT COMPARISON:  CT 01/07/2015 FINDINGS: Gallbladder: Small echogenic foci within the lumen, without definite shadowing. No gallbladder wall thickening or pericholecystic fluid. Sonographer reports no sonographic Murphy's sign. Common bile duct: Diameter: 5.9 mm, unremarkable Liver: No focal lesion identified. Within normal limits in parenchymal echogenicity. IMPRESSION: 1. Suspect small mobile gallbladder calculi. No ultrasound evidence of cholecystitis or biliary obstruction. Electronically Signed   By: Lucrezia Europe M.D.   On: 01/08/2015 15:06    Scheduled Meds: . heparin  5,000 Units Subcutaneous 3 times per day  . potassium chloride  40 mEq Oral Once  . sodium chloride  3 mL Intravenous Q12H   Continuous Infusions: . sodium chloride 75 mL/hr at 01/08/15 2006       Time spent: 35  minutes    [facility] A  [facility] Pager [phone] If 7PM-7AM, please contact night-coverage at [email or web address], password [facility] 01/09/2015, 10:52 AM

## 2015-01-09 NOTE — Progress Notes (Signed)
Spoke with Dr. Hartford Poli about how pt is very emotional when nursing staff in room. States she is very sore with movement, nausea's regardless of zofran. Educated pt that vicoden can make her nauseated when not eating. After talking to Dr. Hartford Poli and Chanetta Marshall NP, pt does not act this way when they are in the room. Will continue to monitor. Carroll Kinds RN

## 2015-01-09 NOTE — Procedures (Signed)
History: 49 yo F with new onset seizures  Sedation: none  Technique: This is a 19 channel routine scalp EEG performed at the bedside with bipolar and monopolar montages arranged in accordance to the international 10/20 system of electrode placement. One channel was dedicated to EKG recording.   Background: The background consists of intermixed alpha and beta activities. There is a well defined posterior dominant rhythm of 9 Hz that attenuates with eye opening. With drowsiness, there is an anterior shifting of the PDR and increase in delta activity.   EEG Abnormalities: None  Clinical Interpretation: This normal EEG is recorded in the waking and drowsy state. There was no seizure or seizure predisposition recorded on this study.   Roland Rack, MD [facility] [phone]  If 7pm- 7am, please page neurology on call as listed in [place].

## 2015-01-09 NOTE — Progress Notes (Signed)
  Echocardiogram 2D Echocardiogram has been performed.  Bobbye Charleston 01/09/2015, 3:58 PM

## 2015-01-09 NOTE — Progress Notes (Signed)
EEG Completed; Results Pending  

## 2015-01-09 NOTE — Plan of Care (Signed)
Problem: Phase I Progression Outcomes Goal: Voiding-avoid urinary catheter unless indicated Outcome: Completed/Met Date Met:  01/09/15 Patient voiding.

## 2015-01-09 NOTE — Consult Note (Signed)
ELECTROPHYSIOLOGY CONSULT NOTE    Patient ID: Alicia Fleming MRN: [id number], DOB/AGE: 10/16/1965 49 y.o.  Admit date: 01/08/2015 Date of Consult: 01/09/2015  Primary Physician: Merrilee Seashore, MD Primary Cardiologist: Einar Gip Electrophysiologist: Lovena Le  Reason for Consultation: syncope and prolonged QT  HPI:  Alicia Fleming is a 49 y.o. female with a past medical history significant for SVT s/p AVNRT ablation, rheumatoid arthritis who presents with an episode of syncope with seizure like activity.  She was seen in the ED on 01-07-15 with abdominal pain, nausea and vomiting. CT of the abdomen/pelvis demonstrated no evidence of SBO and she was discharged home on Zofran and protonix. Later that afternoon, her PCP started her on Cipro and phenergan. On 10-12, after taking her medications, she vomited and had persistent abdominal pain. She stood up to go to the bathroom and the next thing she remembers, EMS was at the house.  She denies prodrome. She did not have palpitations prior to episode. Her family states that was 10 minutes later. She was breathing on her own during that time. She did not have loss of bowel or bladder function.  She was transported to [facility] and admitted for further evaluation. EKG demonstrated prolonged QT and EP has been asked to evaluate for treatment options.    She currently reports ongoing abdominal tenderness and anorexia. She has not had palpitations since ablation in 2014. She does have occasional lightheadedness with excessive heat, but no frank syncope. She has not had chest pain or shortness of breath.   There is a maternal grandmother who's twin brothers died at a young age (details unclear - she will check with family today).  No other family history of sudden death.   EP has been asked to evaluate for treatment options.   Past Medical History  Diagnosis Date  . Rheumatoid arthritis(714.0)   . AVNRT (AV nodal re-entry tachycardia) ([place])     a. s/p  ablation 2014 Dr Lovena Le  . Allergy   . Asthma      Surgical History:  Past Surgical History  Procedure Laterality Date  . Small intestine surgery    . Supraventricular tachycardia ablation N/A 06/30/2012    AVNRT ablation by Dr Lovena Le     Prescriptions prior to admission  Medication Sig Dispense Refill Last Dose  . HYDROcodone-acetaminophen (NORCO/VICODIN) 5-325 MG tablet Take 1-2 tablets by mouth every 4 (four) hours as needed. 20 tablet 0 01/08/2015 at Unknown time    Inpatient Medications:  . heparin  5,000 Units Subcutaneous 3 times per day  . potassium chloride  40 mEq Oral Once  . sodium chloride  3 mL Intravenous Q12H    Allergies: No Known Allergies  Social History   Social History  . Marital Status: Single    Spouse Name: N/A  . Number of Children: N/A  . Years of Education: N/A   Occupational History  . Not on file.   Social History Main Topics  . Smoking status: Never Smoker   . Smokeless tobacco: Not on file  . Alcohol Use: Yes     Comment: occasionally  . Drug Use: No  . Sexual Activity: Yes    Birth Control/ Protection: None   Other Topics Concern  . Not on file   Social History Narrative     Family History  Problem Relation Age of Onset  . Hypertension Mother   . Hypertension Father   . Stroke Maternal Grandmother   . Stroke Maternal Grandfather   . Heart  disease Paternal Grandfather      Review of Systems: All other systems reviewed and are otherwise negative except as noted above.  Physical Exam: Filed Vitals:   01/09/15 0209 01/09/15 0427 01/09/15 0600 01/09/15 0742  BP: 100/56 99/49 119/67   Pulse:  60 56   Temp: 99 F (37.2 C) 99.2 F (37.3 C)  98.7 F (37.1 C)  TempSrc: Oral Oral  Oral  Resp: 14 13 14    Height:      Weight:  157 lb 12.8 oz (71.578 kg)    SpO2:  96% 98%     GEN- The patient is well appearing, alert and oriented x 3 today.   HEENT: normocephalic, atraumatic; sclera clear, conjunctiva pink; hearing  intact; oropharynx clear; neck supple  Lungs- Clear to ausculation bilaterally, normal work of breathing.  No wheezes, rales, rhonchi Heart- Regular rate and rhythm, no murmurs, rubs or gallops  GI- +diffuse abdominal tenderness, soft, non-distended, bowel sounds present  Extremities- no clubbing, cyanosis, or edema; DP/PT/radial pulses 2+ bilaterally MS- no significant deformity or atrophy Skin- warm and dry, no rash or lesion Psych- euthymic mood, full affect Neuro- strength and sensation are intact  Labs:   Lab Results  Component Value Date   WBC 4.6 01/09/2015   HGB 8.1* 01/09/2015   HCT 25.8* 01/09/2015   MCV 74.4* 01/09/2015   PLT 116* 01/09/2015     Recent Labs Lab 01/08/15 1340 01/09/15 0425  NA 136 135  K 3.1* 3.1*  CL 106 107  CO2 19* 21*  BUN <5* 7  CREATININE 0.75 0.67  CALCIUM 9.4 8.4*  PROT 7.9  --   BILITOT 0.3  --   ALKPHOS 60  --   ALT 9*  --   AST 19  --   GLUCOSE 111* 92      Radiology/Studies: Ct Head Wo Contrast 01/08/2015  CLINICAL DATA:  Seizure with syncope.  Transient amnesia. EXAM: CT HEAD WITHOUT CONTRAST TECHNIQUE: Contiguous axial images were obtained from the base of the skull through the vertex without intravenous contrast. COMPARISON:  None. FINDINGS: The ventricles are normal in size and configuration. There is no intracranial mass, hemorrhage, extra-axial fluid collection, or midline shift. The gray-white compartments are normal. No acute infarct evident. The bony calvarium appears intact. The mastoid air cells are clear. There is sphenoid sinus disease posteriorly. IMPRESSION: Sphenoid sinus disease. Study otherwise unremarkable. No intracranial mass, hemorrhage, or focal gray -white compartment lesions/ acute appearing infarct. Electronically Signed   By: Lowella Grip III M.D.   On: 01/08/2015 16:36   US Abdomen Limited Ruq 01/08/2015  CLINICAL DATA:  Right upper abdominal pain, nausea, vomiting x4 days EXAM: US ABDOMEN LIMITED -  RIGHT UPPER QUADRANT COMPARISON:  CT 01/07/2015 FINDINGS: Gallbladder: Small echogenic foci within the lumen, without definite shadowing. No gallbladder wall thickening or pericholecystic fluid. Sonographer reports no sonographic Murphy's sign. Common bile duct: Diameter: 5.9 mm, unremarkable Liver: No focal lesion identified. Within normal limits in parenchymal echogenicity. IMPRESSION: 1. Suspect small mobile gallbladder calculi. No ultrasound evidence of cholecystitis or biliary obstruction. Electronically Signed   By: Lucrezia Europe M.D.   On: 01/08/2015 15:06    EKG: sinus rhythm, prolonged QT  TELEMETRY: sinus rhythm, sinus bradycardia  Assessment/Plan: 1.  Syncope/prolonged QT Likely vasovagal given prolonged episode and recent GI illness.  I have a low suspicion for cardiac arrhythmia as the cause of this event. Ongoing workup by neurology for seizures. Will check Mg this morning  Keep K >  3.9, Mg >1.8 No driving x 6 months per [place] DMV Would like to add Nadolol, but will discuss with Dr Lovena Le given relatively slow sinus rates Avoid QT prolonging drugs going forward  2.  AVNRT No clinical recurrence s/p ablation  3.  Hypokalemia Repleted by primary team Recheck BMET in the morning  Will arrange outpatient EP follow up.    Signed, Chanetta Marshall, NP 01/09/2015 10:05 AM  EP Attending  Patient seen and examined. She remains nauseated. I have reviewed the findings as documented by Chanetta Marshall, NP-C. She has been sick for several days with anorexia and vomiting and passed out after getting up. She was out for over 5 minutes and had minimal if any recollection of the events. The patient has been found to have a prolongation of the QT interval in the setting of electrolyte abnormalities and phenergen and cipro. No striking family history of sudden death. Her baseline QT prolongation is mild.  Would recommend avoiding all QT prolonging drugs, consider beta blocker if her HR and blood  pressure allow. We like Nadolol best in the setting of QT prolongation. The likely etiology of her syncope was almost certainly autonomic, not due to QT prolongation. Would keep her on tele while in the hospital.   Mikle Bosworth.D.

## 2015-01-10 DIAGNOSIS — R1011 Right upper quadrant pain: Secondary | ICD-10-CM | POA: Diagnosis not present

## 2015-01-10 DIAGNOSIS — E440 Moderate protein-calorie malnutrition: Secondary | ICD-10-CM | POA: Diagnosis not present

## 2015-01-10 DIAGNOSIS — J45909 Unspecified asthma, uncomplicated: Secondary | ICD-10-CM | POA: Diagnosis not present

## 2015-01-10 DIAGNOSIS — I4581 Long QT syndrome: Secondary | ICD-10-CM | POA: Diagnosis not present

## 2015-01-10 DIAGNOSIS — M069 Rheumatoid arthritis, unspecified: Secondary | ICD-10-CM | POA: Diagnosis not present

## 2015-01-10 DIAGNOSIS — R55 Syncope and collapse: Secondary | ICD-10-CM | POA: Diagnosis not present

## 2015-01-10 DIAGNOSIS — R110 Nausea: Secondary | ICD-10-CM | POA: Diagnosis not present

## 2015-01-10 LAB — BASIC METABOLIC PANEL
Anion gap: 7 (ref 5–15)
BUN: 9 mg/dL (ref 6–20)
CHLORIDE: 109 mmol/L (ref 101–111)
CO2: 18 mmol/L — AB (ref 22–32)
Calcium: 8.4 mg/dL — ABNORMAL LOW (ref 8.9–10.3)
Creatinine, Ser: 0.57 mg/dL (ref 0.44–1.00)
GFR calc non Af Amer: >60 mL/min (ref >60)
Glucose, Bld: 86 mg/dL (ref 65–99)
POTASSIUM: 4 mmol/L (ref 3.5–5.1)
SODIUM: 134 mmol/L — AB (ref 135–145)

## 2015-01-10 MED ORDER — PROMETHAZINE HCL 25 MG PO TABS: 25.0000 mg | ORAL_TABLET | Freq: Four times a day (QID) | ORAL | Status: DC | PRN

## 2015-01-10 NOTE — Discharge Summary (Addendum)
Physician Discharge Summary  Alicia Fleming HER:[id number] DOB: 1965-03-31 DOA: 01/08/2015  PCP: Merrilee Seashore, MD  Admit date: 01/08/2015 Discharge date: 01/10/2015  Time spent: 40 minutes  Recommendations for Outpatient Follow-up:  1. Follow-up with primary care physician within one week. 2. Follow-up with Dr. Lovena Le.  Discharge Diagnoses:  Active Problems:   Prolonged Q-T interval on ECG   Syncope   Nausea   RUQ abdominal pain   Seizure (HCC)   Malnutrition of moderate degree   Discharge Condition: Stable  Diet recommendation: Regular  Filed Weights   01/08/15 2000 01/09/15 0427 01/10/15 0500  Weight: 68.901 kg (151 lb 14.4 oz) 71.578 kg (157 lb 12.8 oz) 70.806 kg (156 lb 1.6 oz)    History of present illness:  Alicia Fleming is a 49 y.o. female, past medical history of SVT, presents with an episode of loss of consciousness, with seizure-like activity, patient was in ED yesterday complaining of abdominal pain, nausea and vomiting, known history of small bowel obstruction, CT abdomen pelvis done with no evidence of small bowel obstruction, discharged home on Zofran, Protonix and Norco, he called her PCP this a.m., was started on ciprofloxacin, and Phenergan, after patient took these medications, she had an episode of loss of consciousness, was witnessed by family, patient reports palpitation preceding this episode ( both her previous SVTprior to ablation) , had generalized body shaking lasted for 10 minutes, unresponsive during that episode, but no tongue biting, no urinary or stool incontinence, responded to EMS upon presentation, NAD abdomen ultrasound was significant for small mobile calculi in gallbladder, CT head with no acute finding, mildly prolonged QTC at 497, hospitalist requested to admit for further evaluation.  Hospital Course:   Syncope versus seizures -Patient's clinical presentation is questionable seizure versus syncope -Has known history of SVT in the  past status post catheter ablation. -MRI showed no acute events, EEG is normal -Seen by neurology a [place] loaded with Keppra, neurology think she does not need any antiepileptics. -Patient was not confused after she regained her consciousness (no postictal state). -Presentation appears to be syncope secondary to dehydration and hypovolemia from GI losses. -Seen by EP cardiology, agrees patient probably has syncope secondary to hypovolemia. -Follow-up with cardiology as outpatient, they recommended nadolol, not started because of heart rate averaged between 55-60.  Nausea ,vomiting and abdominal pain -CT abdomen pelvis done yesterday with no acute findings -Ultrasound significant for small mobile calculi, as well possible gastritis/gastroenteritis, continue with IV fluids. -No evidence of acute cholecystitis is no fever or chills, no postprandial symptoms. -Vomiting resolved, patient asked for Phenergan prescription.  -Patient instructed to come back to the ED if she develops the vomiting again.  Prolonged QTC -Borderline prolonged, will repeat EKG in a.m., continue to monitor on telemetry. -Per cardiology this is likely secondary to the hypokalemia, on discharge potassium is 4 and magnesium is 1.8.  Loss of appetite, weight loss and depressed mood. -TSH is normal, patient has loss of appetite and depressed mood, instructed to follow-up with primary care physician.  Malnutrition -Per RD malnutrition related to acute illness with more than 7.5% loss in the past 3 months.  Procedures:  EEG: Normal EEG done on 01/09/2015.  2-D echo done on 01/09/2015 Study Conclusions  - Left ventricle: The cavity size was normal. Wall thickness wasincreased in a pattern of mild LVH. Systolic function was normal.The estimated ejection fraction was in the range of 60% to 65%.Wall motion was normal; there were no regional wall motion abnormalities. Left ventricular diastolic  function parameterswere  normal. - Aortic valve: Transvalvular velocity was within the normal range.There was no stenosis. There was no regurgitation. - Mitral valve: There was mild regurgitation. - Right ventricle: The cavity size was normal. Wall thickness wasnormal. Systolic function was normal. - Atrial septum: No defect or patent foramen ovale was identified. - Tricuspid valve: There was trivial regurgitation. - Pulmonic valve: There was trivial regurgitation.  Consultations:  Cardiology.  Neurology  Discharge Exam: Filed Vitals:   01/10/15 0500  BP: 132/70  Pulse: 58  Temp: 98.6 F (37 C)  Resp:   General: Alert and awake, oriented x3, not in any acute distress. HEENT: anicteric sclera, pupils reactive to light and accommodation, EOMI CVS: S1-S2 clear, no murmur rubs or gallops Chest: clear to auscultation bilaterally, no wheezing, rales or rhonchi Abdomen: soft nontender, nondistended, normal bowel sounds, no organomegaly Extremities: no cyanosis, clubbing or edema noted bilaterally Neuro: Cranial nerves II-XII intact, no focal neurological deficits  Discharge Instructions   Discharge Instructions    Diet - low sodium heart healthy    Complete by:  As directed      Increase activity slowly    Complete by:  As directed           Current Discharge Medication List    START taking these medications   Details  promethazine (PHENERGAN) 25 MG tablet Take 1 tablet (25 mg total) by mouth every 6 (six) hours as needed for nausea or vomiting. Qty: 30 tablet, Refills: 0      STOP taking these medications     HYDROcodone-acetaminophen (NORCO/VICODIN) 5-325 MG tablet        No Known Allergies Follow-up Information    Follow up with Cristopher Peru, MD On 02/14/2015.   Specialty:  Cardiology   Why:  at 10:15AM   Contact information:   [address] [place] [address] [phone]       Follow up with [facility], MD In 1 week.   Specialty:  Internal  Medicine   Contact information:   [address] [place] [address] [phone]        The results of significant diagnostics from this hospitalization (including imaging, microbiology, ancillary and laboratory) are listed below for reference.    Significant Diagnostic Studies: Ct Head Wo Contrast  01/08/2015  CLINICAL DATA:  Seizure with syncope.  Transient amnesia. EXAM: CT HEAD WITHOUT CONTRAST TECHNIQUE: Contiguous axial images were obtained from the base of the skull through the vertex without intravenous contrast. COMPARISON:  None. FINDINGS: The ventricles are normal in size and configuration. There is no intracranial mass, hemorrhage, extra-axial fluid collection, or midline shift. The gray-white compartments are normal. No acute infarct evident. The bony calvarium appears intact. The mastoid air cells are clear. There is sphenoid sinus disease posteriorly. IMPRESSION: Sphenoid sinus disease. Study otherwise unremarkable. No intracranial mass, hemorrhage, or focal gray -white compartment lesions/ acute appearing infarct. Electronically Signed   By: Lowella Grip III M.D.   On: 01/08/2015 16:36   Mr Brain Wo Contrast  01/08/2015  CLINICAL DATA:  Seizure. Episode of loss of consciousness with seizure-like activity. Personal history of supraventricular tachycardia. EXAM: MRI HEAD WITHOUT CONTRAST TECHNIQUE: Multiplanar, multiecho pulse sequences of the brain and surrounding structures were obtained without intravenous contrast. COMPARISON:  CT head without contrast from the same day. FINDINGS: No acute infarct, hemorrhage, or mass lesion is present. The ventricles are of normal size. No significant extraaxial fluid collection is present. No significant white  matter disease is present. The internal auditory canals are within normal limits. The brainstem is normal. Flow is present in the major intracranial arteries. Globes and orbits are intact. Mucosal thickening and  fluid is present in the sphenoid sinuses bilaterally. Minimal mucosal thickening is present in the anterior ethmoid air cells. The remaining paranasal sinuses are clear. Dedicated imaging of the temporal lobes demonstrate symmetric size and signal of the hippocampal structures bilaterally. IMPRESSION: 1. Normal MRI appearance of the brain. No acute or focal lesion to explain seizure. 2. Bilateral sphenoid sinus disease. Electronically Signed   By: San Morelle M.D.   On: 01/08/2015 19:13   Ct Abdomen Pelvis W Contrast  01/07/2015  CLINICAL DATA:  Periumbilical pain for 3 days. Severe vomiting. Patient has a history of bowel obstruction. EXAM: CT ABDOMEN AND PELVIS WITH CONTRAST TECHNIQUE: Multidetector CT imaging of the abdomen and pelvis was performed using the standard protocol following bolus administration of intravenous contrast. CONTRAST:  1101mL OMNIPAQUE IOHEXOL 300 MG/ML  SOLN COMPARISON:  06/06/2009 FINDINGS: The lung bases are unremarkable. The spleen, adrenals, pancreas, kidneys are unremarkable. There is a subtle decreased attenuation with geographic appearance along the falciform ligament of the liver. Gallbladder is folded, but otherwise normal in appearance. There is no evidence of bowel obstruction, enteritis, colitis, diverticulitis, nor appendicitis. Prior small bowel resection is noted in the mid abdomen. Small outpouching measuring 2.4 cm is seen at the enterotomy line. There is no evidence of appendicitis. The colon is decompressed. There is no evidence of abdominal aortic aneurysm. The celiac, SMA, IMA, portal vein, SMV are opacified. There is no evidence of abdominal or pelvic free fluid, loculated fluid collections, masses, nor adenopathy scant mural calcifications identified within the aorta and mesenteric vessels. The uterus is enlarged and globular containing multiple myometrial masses. There is a mostly exophytic myometrial mass off of the left uterine fundus extends well into  the upper abdomen which measures 9.2 by 6.7 by 8.8 cm. It has significantly increased in size when compared to the prior study. There are bilateral ovarian cysts, the larger cyst on the right ovary measures 4 cm. There is no evidence of abdominal wall or inguinal hernia. There is no evidence of aggressive appearing osseous lesions. IMPRESSION: Subtle hypoattenuated geographic area adjacent to the falciform ligament of the liver, which may represent focal fatty infiltration. Significant pathology such as soft tissue mass, or liver abscess is considered less likely. However, if there is any clinical suspicion for liver pathology, MRI of the abdomen may be obtained for further evaluation. Status post small bowel resection, without evidence of small-bowel obstruction. Large leiomyomatous uterus. There is a mostly exophytic 9.2 cm in largest dimension myometrial mass off of the left fundus of the uterus which demonstrates significant enlargement, when compared to the prior study. Ob-GYN consult may be considered. Bilateral ovarian cysts, likely benign in a premenopausal patient. Electronically Signed   By: Fidela Salisbury M.D.   On: 01/07/2015 11:43   US Abdomen Limited Ruq  01/08/2015  CLINICAL DATA:  Right upper abdominal pain, nausea, vomiting x4 days EXAM: US ABDOMEN LIMITED - RIGHT UPPER QUADRANT COMPARISON:  CT 01/07/2015 FINDINGS: Gallbladder: Small echogenic foci within the lumen, without definite shadowing. No gallbladder wall thickening or pericholecystic fluid. Sonographer reports no sonographic Murphy's sign. Common bile duct: Diameter: 5.9 mm, unremarkable Liver: No focal lesion identified. Within normal limits in parenchymal echogenicity. IMPRESSION: 1. Suspect small mobile gallbladder calculi. No ultrasound evidence of cholecystitis or biliary obstruction. Electronically Signed  By: Lucrezia Europe M.D.   On: 01/08/2015 15:06    Microbiology: No results found for this or any previous visit (from  the past 240 hour(s)).   Labs: Basic Metabolic Panel:  Recent Labs Lab 01/07/15 1005 01/08/15 1340 01/09/15 0425 01/09/15 0918 01/10/15 0345  NA 132* 136 135  --  134*  K 3.7 3.1* 3.1*  --  4.0  CL 102 106 107  --  109  CO2 21* 19* 21*  --  18*  GLUCOSE 136* 111* 92  --  86  BUN <5* <5* 7  --  9  CREATININE 0.59 0.75 0.67  --  0.57  CALCIUM 9.3 9.4 8.4*  --  8.4*  MG  --   --   --  1.8  --    Liver Function Tests:  Recent Labs Lab 01/07/15 1005 01/08/15 1340  AST 18 19  ALT 10* 9*  ALKPHOS 61 60  BILITOT 0.5 0.3  PROT 7.7 7.9  ALBUMIN 3.5 3.5    Recent Labs Lab 01/07/15 1005 01/08/15 1340  LIPASE 24 31   No results for input(s): AMMONIA in the last 168 hours. CBC:  Recent Labs Lab 01/07/15 1005 01/08/15 1549 01/09/15 0425  WBC 4.8 3.3* 4.6  NEUTROABS  --  2.5  --   HGB 9.8* 9.5* 8.1*  HCT 30.0* 28.6* 25.8*  MCV 73.3* 72.6* 74.4*  PLT 152 117* 116*   Cardiac Enzymes: No results for input(s): CKTOTAL, CKMB, CKMBINDEX, TROPONINI in the last 168 hours. BNP: BNP (last 3 results) No results for input(s): BNP in the last 8760 hours.  ProBNP (last 3 results) No results for input(s): PROBNP in the last 8760 hours.  CBG: No results for input(s): GLUCAP in the last 168 hours.     Signed:  Jackqulyn Mendel A  [facility] 01/10/2015, 10:40 AM

## 2015-01-10 NOTE — Discharge Instructions (Signed)

## 2015-02-14 ENCOUNTER — Encounter: Payer: 59 | Admitting: Internal Medicine

## 2015-03-21 ENCOUNTER — Ambulatory Visit (INDEPENDENT_AMBULATORY_CARE_PROVIDER_SITE_OTHER): Payer: 59 | Admitting: Internal Medicine

## 2015-03-21 ENCOUNTER — Encounter: Payer: Self-pay | Admitting: Internal Medicine

## 2015-03-21 VITALS — BP 110/76 | HR 66 | Ht 67.0 in | Wt 142.0 lb

## 2015-03-21 DIAGNOSIS — R55 Syncope and collapse: Secondary | ICD-10-CM | POA: Diagnosis not present

## 2015-03-21 DIAGNOSIS — I471 Supraventricular tachycardia, unspecified: Secondary | ICD-10-CM

## 2015-03-21 DIAGNOSIS — I4581 Long QT syndrome: Secondary | ICD-10-CM | POA: Diagnosis not present

## 2015-03-21 DIAGNOSIS — R9431 Abnormal electrocardiogram [ECG] [EKG]: Secondary | ICD-10-CM

## 2015-03-21 NOTE — Assessment & Plan Note (Signed)
Her QT is not prolonged. Will follow. She will avoid QT prolonging drugs.

## 2015-03-21 NOTE — Assessment & Plan Note (Signed)
She has had no recurrent SVT. She will undergo watchful waiting. I'll see her back in several months.

## 2015-03-21 NOTE — Progress Notes (Signed)
      HPI Alicia Fleming returns today for followup. She is a pleasant 49 yo woman with a h/o SVT who underwent ablation approx. 2 years ago. She presented 2 months ago with syncope after a GI illness associated with hypokalemia and dehydration. Her QT was noted to be prolonged. She has done well in the interim. She denies chest pain, sob, palpitations or syncope.  No Known Allergies   No current outpatient prescriptions on file.   No current facility-administered medications for this visit.     Past Medical History  Diagnosis Date  . Rheumatoid arthritis(714.0)   . AVNRT (AV nodal re-entry tachycardia) ([place])     a. s/p ablation 2014 Dr Lovena Le  . Allergy   . Asthma     ROS:   All systems reviewed and negative except as noted in the HPI.   Past Surgical History  Procedure Laterality Date  . Small intestine surgery    . Supraventricular tachycardia ablation N/A 06/30/2012    AVNRT ablation by Dr Lovena Le     Family History  Problem Relation Age of Onset  . Hypertension Mother   . Hypertension Father   . Stroke Maternal Grandmother   . Stroke Maternal Grandfather   . Heart disease Paternal Grandfather      Social History   Social History  . Marital Status: Single    Spouse Name: N/A  . Number of Children: N/A  . Years of Education: N/A   Occupational History  . Not on file.   Social History Main Topics  . Smoking status: Never Smoker   . Smokeless tobacco: Not on file  . Alcohol Use: Yes     Comment: occasionally  . Drug Use: No  . Sexual Activity: Yes    Birth Control/ Protection: None   Other Topics Concern  . Not on file   Social History Narrative     BP 110/76 mmHg  Pulse 66  Ht 5\' 7"  (1.702 m)  Wt 142 lb (64.411 kg)  BMI 22.24 kg/m2  Physical Exam:  Well appearing NAD HEENT: Unremarkable Neck:  No JVD, no thyromegally Lymphatics:  No adenopathy Back:  No CVA tenderness Lungs:  Clear with no wheezes HEART:  Regular rate rhythm, no  murmurs, no rubs, no clicks Abd:  soft, positive bowel sounds, no organomegally, no rebound, no guarding Ext:  2 plus pulses, no edema, no cyanosis, no clubbing Skin:  No rashes no nodules Neuro:  CN II through XII intact, motor grossly intact  EKG - NSR   Assess/Plan:

## 2015-03-21 NOTE — Assessment & Plan Note (Signed)
She has had no recurrent symptoms. She will undergo watchful waiting.

## 2015-03-21 NOTE — Patient Instructions (Signed)
Medication Instructions:  Your physician recommends that you continue on your current medications as directed. Please refer to the Current Medication list given to you today.  Labwork: None ordered.  Testing/Procedures: None ordered.  Follow-Up: Your physician recommends that you schedule a follow-up appointment as needed.   Any Other Special Instructions Will Be Listed Below (If Applicable).     If you need a refill on your cardiac medications before your next appointment, please call your pharmacy.   

## 2015-10-01 ENCOUNTER — Other Ambulatory Visit: Payer: Self-pay | Admitting: Obstetrics and Gynecology

## 2015-10-01 DIAGNOSIS — D259 Leiomyoma of uterus, unspecified: Secondary | ICD-10-CM

## 2015-10-15 ENCOUNTER — Other Ambulatory Visit: Payer: 59

## 2015-10-21 ENCOUNTER — Ambulatory Visit
Admission: RE | Admit: 2015-10-21 | Discharge: 2015-10-21 | Disposition: A | Discharge status: Home / Self Care | Payer: 59 | Source: Ambulatory Visit | Attending: Obstetrics and Gynecology | Admitting: Obstetrics and Gynecology

## 2015-10-21 ENCOUNTER — Other Ambulatory Visit: Payer: Self-pay | Admitting: Obstetrics and Gynecology

## 2015-10-21 DIAGNOSIS — D259 Leiomyoma of uterus, unspecified: Secondary | ICD-10-CM

## 2015-10-21 HISTORY — DX: Benign neoplasm of connective and other soft tissue, unspecified: D21.9

## 2015-10-21 HISTORY — PX: IR GENERIC HISTORICAL: IMG1180011

## 2015-10-21 NOTE — Consult Note (Signed)
Chief Complaint: Patient was seen in consultation today for  Chief Complaint  Patient presents with  . Advice Only    Consult for [place]    at the request of Cousins,Sheronette  Referring Physician(s): Cousins,Sheronette   History of Present Illness: Alicia Fleming is a 50 y.o. female with known uterine fibroids by imaging. The patient complains of heavy menstrual bleeding and abdominal cramping. The menstrual bleeding lasts for approximately 5 days with at least 4 days of heavy bleeding with clotting. Patient is using both tampons and pads during the bleeding and changing every 2 hours. Her menstrual cycle is regular at 28 days. No interperiod bleeding. She also complains of some abdominal bloating with pelvic pressure and urinary frequency and urgency. She also has some back pain. Pregnancy history is G1 P0. Patient has an adopted child. Patient has no plans for additional pregnancy. Pap smear on 07/02/2015 was negative. Endometrial biopsy on 10/08/2015 was negative for hyperplasia. Past medical history significant for SVT but the patient is currently asymptomatic and status post ablation procedure.  Past Medical History:  Diagnosis Date  . Allergy   . Asthma   . AVNRT (AV nodal re-entry tachycardia) ([place])    a. s/p ablation 2014 Dr Lovena Le  . Fibroid   . Rheumatoid arthritis(714.0)     Past Surgical History:  Procedure Laterality Date  . SMALL INTESTINE SURGERY    . SUPRAVENTRICULAR TACHYCARDIA ABLATION N/A 06/30/2012   AVNRT ablation by Dr Lovena Le    Allergies: Review of patient's allergies indicates no known allergies.  Medications: Prior to Admission medications   Medication Sig Start Date End Date Taking? Authorizing Provider  aspirin-acetaminophen-caffeine (EXCEDRIN MIGRAINE) [phone] MG tablet Take 1 tablet by mouth every 6 (six) hours as needed for headache.   Yes Historical Provider, MD  cetirizine (ZYRTEC) 10 MG tablet Take 10 mg by mouth daily as needed for  allergies.   Yes Historical Provider, MD     Family History  Problem Relation Age of Onset  . Hypertension Mother   . Hypertension Father   . Stroke Maternal Grandmother   . Stroke Maternal Grandfather   . Heart disease Paternal Grandfather     Social History   Social History  . Marital status: Single    Spouse name: N/A  . Number of children: N/A  . Years of education: N/A   Social History Main Topics  . Smoking status: Never Smoker  . Smokeless tobacco: Not on file  . Alcohol use Yes     Comment: occasionally  . Drug use: No  . Sexual activity: Yes    Birth control/ protection: None   Other Topics Concern  . Not on file   Social History Narrative  . No narrative on file     Review of Systems  Respiratory: Negative.   Cardiovascular: Negative.   Gastrointestinal: Positive for abdominal distention and abdominal pain.  Genitourinary: Positive for menstrual problem, pelvic pain and urgency.    Vital Signs: BP (!) 96/58 (BP Location: Left Arm, Patient Position: Sitting, Cuff Size: Normal)   Pulse 76   Temp 98.3 F (36.8 C) (Oral)   Resp 14   Ht 5\' 7"  (1.702 m)   Wt 143 lb (64.9 kg)   LMP 10/13/2015 (Exact Date)   SpO2 100%   BMI 22.40 kg/m   Physical Exam  Constitutional: She appears well-developed and well-nourished.  Cardiovascular: Normal rate, regular rhythm and intact distal pulses.   No murmur  heard. Pulmonary/Chest: Effort normal and breath sounds normal.  Abdominal: Soft. Bowel sounds are normal.  Fullness in the anterior pelvis related to fibroid uterus.  Musculoskeletal: She exhibits no edema.        Imaging: CT abdomen and pelvis 01/07/2015: CLINICAL DATA:  Periumbilical pain for 3 days. Severe vomiting. Patient has a history of bowel obstruction.  EXAM: CT ABDOMEN AND PELVIS WITH CONTRAST  TECHNIQUE: Multidetector CT imaging of the abdomen and pelvis was performed using the standard protocol following bolus administration  of intravenous contrast.  CONTRAST:  110mL OMNIPAQUE IOHEXOL 300 MG/ML  SOLN  COMPARISON:  06/06/2009  FINDINGS: The lung bases are unremarkable.  The spleen, adrenals, pancreas, kidneys are unremarkable. There is a subtle decreased attenuation with geographic appearance along the falciform ligament of the liver. Gallbladder is folded, but otherwise normal in appearance.  There is no evidence of bowel obstruction, enteritis, colitis, diverticulitis, nor appendicitis. Prior small bowel resection is noted in the mid abdomen. Small outpouching measuring 2.4 cm is seen at the enterotomy line. There is no evidence of appendicitis. The colon is decompressed.  There is no evidence of abdominal aortic aneurysm. The celiac, SMA, IMA, portal vein, SMV are opacified.  There is no evidence of abdominal or pelvic free fluid, loculated fluid collections, masses, nor adenopathy scant mural calcifications identified within the aorta and mesenteric vessels.  The uterus is enlarged and globular containing multiple myometrial masses. There is a mostly exophytic myometrial mass off of the left uterine fundus extends well into the upper abdomen which measures 9.2 by 6.7 by 8.8 cm. It has significantly increased in size when compared to the prior study. There are bilateral ovarian cysts, the larger cyst on the right ovary measures 4 cm.  There is no evidence of abdominal wall or inguinal hernia.  There is no evidence of aggressive appearing osseous lesions.  IMPRESSION: Subtle hypoattenuated geographic area adjacent to the falciform ligament of the liver, which may represent focal fatty infiltration. Significant pathology such as soft tissue mass, or liver abscess is considered less likely. However, if there is any clinical suspicion for liver pathology, MRI of the abdomen may be obtained for further evaluation.  Status post small bowel resection, without evidence of  small-bowel obstruction.  Large leiomyomatous uterus. There is a mostly exophytic 9.2 cm in largest dimension myometrial mass off of the left fundus of the uterus which demonstrates significant enlargement, when compared to the prior study. Ob-GYN consult may be considered.  Bilateral ovarian cysts, likely benign in a premenopausal patient.   Electronically Signed   By: Fidela Salisbury M.D.   On: 01/07/2015 11:43 Labs:  CBC:  Recent Labs  01/07/15 1005 01/08/15 1549 01/09/15 0425  WBC 4.8 3.3* 4.6  HGB 9.8* 9.5* 8.1*  HCT 30.0* 28.6* 25.8*  PLT 152 117* 116*    COAGS: No results for input(s): INR, APTT in the last 8760 hours.  BMP:  Recent Labs  01/07/15 1005 01/08/15 1340 01/09/15 0425 01/10/15 0345  NA 132* 136 135 134*  K 3.7 3.1* 3.1* 4.0  CL 102 106 107 109  CO2 21* 19* 21* 18*  GLUCOSE 136* 111* 92 86  BUN <5* <5* 7 9  CALCIUM 9.3 9.4 8.4* 8.4*  CREATININE 0.59 0.75 0.67 0.57  GFRNONAA >60 >60 >60 >60  GFRAA >60 >60 >60 >60    LIVER FUNCTION TESTS:  Recent Labs  01/07/15 1005 01/08/15 1340  BILITOT 0.5 0.3  AST 18 19  ALT 10* 9*  ALKPHOS 61 60  PROT 7.7 7.9  ALBUMIN 3.5 3.5    TUMOR MARKERS: No results for input(s): AFPTM, CEA, CA199, CHROMGRNA in the last 8760 hours.  Assessment and Plan:  50 year old with uterine fibroids associated with menorrhagia, dysmenorrhea and anemia. The fibroid burden is quite impressive based on the prior CT examination. There is a large exophytic or subserosal fibroid along the cephalad aspect of the uterus. Patient is aware of the different fibroid treatment options including hysterectomy. At this time, the patient is most interested in a minimally invasive procedure such as uterine artery embolization. We discussed the uterine artery embolization procedure in depth. I explain the postprocedure recovery and expectations. The risks of the procedure including bleeding, infection and vascular injury were  discussed with the patient.   We need to perform a pelvic MRI to further characterize the uterine fibroids. Patient was a little shocked by the appearance of her fibroid disease when I showed her the prior CT imaging. She was also concerned about the risk of fibroid dislodgment following the embolization procedure. Based on the CT examination, I think the patient would be low risk for fibroid dislodgement or expulsion but this will be better characterized with the pelvic MRI.  Plan to get a pelvic MRI with and without contrast. Patient already had a negative endometrial biopsy. If there is no contraindications based on the pelvic MRI, then the patient would be a candidate for uterine artery embolization procedure.  Thank you for this interesting consult.  I greatly enjoyed meeting AUBRIE POMPEO and look forward to participating in their care.  A copy of this report was sent to the requesting provider on this date.  Electronically Signed: Carylon Perches 10/21/2015, 2:06 PM   I spent a total of  30 Minutes   in face to face in clinical consultation, greater than 50% of which was counseling/coordinating care for uterine fibroids.

## 2015-10-29 ENCOUNTER — Ambulatory Visit
Admission: RE | Admit: 2015-10-29 | Discharge: 2015-10-29 | Disposition: A | Discharge status: Home / Self Care | Payer: 59 | Source: Ambulatory Visit | Attending: Obstetrics and Gynecology | Admitting: Obstetrics and Gynecology

## 2015-10-29 DIAGNOSIS — D259 Leiomyoma of uterus, unspecified: Secondary | ICD-10-CM

## 2015-10-29 MED ORDER — GADOBENATE DIMEGLUMINE 529 MG/ML IV SOLN
13.0000 mL | Freq: Once | INTRAVENOUS | Status: AC | PRN
  Administered 2015-10-29: 13 mL via INTRAVENOUS

## 2015-11-04 ENCOUNTER — Other Ambulatory Visit: Payer: Self-pay | Admitting: Diagnostic Radiology

## 2015-11-04 DIAGNOSIS — D259 Leiomyoma of uterus, unspecified: Secondary | ICD-10-CM

## 2015-11-25 ENCOUNTER — Other Ambulatory Visit: Payer: Self-pay | Admitting: Physician Assistant

## 2015-11-26 ENCOUNTER — Encounter (HOSPITAL_COMMUNITY): Payer: Self-pay | Admitting: Interventional Radiology

## 2015-11-26 ENCOUNTER — Ambulatory Visit (HOSPITAL_COMMUNITY)
Admission: RE | Admit: 2015-11-26 | Discharge: 2015-11-26 | Disposition: A | Discharge status: Home / Self Care | Payer: 59 | Source: Ambulatory Visit | Attending: Diagnostic Radiology | Admitting: Diagnostic Radiology

## 2015-11-26 ENCOUNTER — Observation Stay (HOSPITAL_COMMUNITY)
Admission: RE | Admit: 2015-11-26 | Discharge: 2015-11-27 | Disposition: A | Discharge status: Home / Self Care | Payer: 59 | Source: Ambulatory Visit | Attending: Interventional Radiology | Admitting: Interventional Radiology

## 2015-11-26 DIAGNOSIS — Z79899 Other long term (current) drug therapy: Secondary | ICD-10-CM | POA: Insufficient documentation

## 2015-11-26 DIAGNOSIS — D259 Leiomyoma of uterus, unspecified: Secondary | ICD-10-CM | POA: Diagnosis present

## 2015-11-26 DIAGNOSIS — D219 Benign neoplasm of connective and other soft tissue, unspecified: Secondary | ICD-10-CM | POA: Diagnosis present

## 2015-11-26 DIAGNOSIS — M069 Rheumatoid arthritis, unspecified: Secondary | ICD-10-CM | POA: Diagnosis not present

## 2015-11-26 HISTORY — PX: IR GENERIC HISTORICAL: IMG1180011

## 2015-11-26 LAB — CBC
HEMATOCRIT: 28.7 % — AB (ref 36.0–46.0)
Hemoglobin: 8.8 g/dL — ABNORMAL LOW (ref 12.0–15.0)
MCH: 20.7 pg — ABNORMAL LOW (ref 26.0–34.0)
MCHC: 30.7 g/dL (ref 30.0–36.0)
MCV: 67.5 fL — AB (ref 78.0–100.0)
Platelets: 266 10*3/uL (ref 150–400)
RBC: 4.25 MIL/uL (ref 3.87–5.11)
RDW: 18 % — AB (ref 11.5–15.5)
WBC: 3.5 10*3/uL — ABNORMAL LOW (ref 4.0–10.5)

## 2015-11-26 LAB — BASIC METABOLIC PANEL
Anion gap: 6 (ref 5–15)
BUN: 8 mg/dL (ref 6–20)
CHLORIDE: 107 mmol/L (ref 101–111)
CO2: 23 mmol/L (ref 22–32)
Calcium: 9.3 mg/dL (ref 8.9–10.3)
Creatinine, Ser: 0.63 mg/dL (ref 0.44–1.00)
GFR calc Af Amer: >60 mL/min (ref >60)
GFR calc non Af Amer: >60 mL/min (ref >60)
GLUCOSE: 98 mg/dL (ref 65–99)
POTASSIUM: 4.1 mmol/L (ref 3.5–5.1)
Sodium: 136 mmol/L (ref 135–145)

## 2015-11-26 LAB — HCG, SERUM, QUALITATIVE: Preg, Serum: NEGATIVE

## 2015-11-26 LAB — APTT: aPTT: 31 seconds (ref 24–36)

## 2015-11-26 LAB — PROTIME-INR
INR: 0.97
Prothrombin Time: 12.9 seconds (ref 11.4–15.2)

## 2015-11-26 MED ORDER — ONDANSETRON HCL 4 MG/2ML IJ SOLN
4.0000 mg | Freq: Four times a day (QID) | INTRAMUSCULAR | Status: DC | PRN
  Administered 2015-11-26 – 2015-11-27 (×2): 4 mg via INTRAVENOUS
  Filled 2015-11-26: qty 2

## 2015-11-26 MED ORDER — DIPHENHYDRAMINE HCL 50 MG/ML IJ SOLN: 12.5000 mg | Freq: Four times a day (QID) | INTRAMUSCULAR | Status: DC | PRN

## 2015-11-26 MED ORDER — HYDROCODONE-ACETAMINOPHEN 5-325 MG PO TABS
1.0000 | ORAL_TABLET | ORAL | Status: DC | PRN
  Administered 2015-11-27 (×2): 2 via ORAL
  Filled 2015-11-26 (×2): qty 2

## 2015-11-26 MED ORDER — LIDOCAINE HCL 1 % IJ SOLN
INTRAMUSCULAR | Status: AC
  Filled 2015-11-26: qty 20

## 2015-11-26 MED ORDER — DEXAMETHASONE SODIUM PHOSPHATE 10 MG/ML IJ SOLN
8.0000 mg | Freq: Once | INTRAMUSCULAR | Status: AC
  Administered 2015-11-26: 8 mg via INTRAVENOUS
  Filled 2015-11-26: qty 1

## 2015-11-26 MED ORDER — ONDANSETRON HCL 4 MG/2ML IJ SOLN: 4.0000 mg | Freq: Four times a day (QID) | INTRAMUSCULAR | Status: DC | PRN

## 2015-11-26 MED ORDER — CEFAZOLIN SODIUM-DEXTROSE 2-4 GM/100ML-% IV SOLN
2.0000 g | INTRAVENOUS | Status: AC
  Administered 2015-11-26: 2 g via INTRAVENOUS
  Filled 2015-11-26: qty 100

## 2015-11-26 MED ORDER — DIPHENHYDRAMINE HCL 12.5 MG/5ML PO ELIX: 12.5000 mg | ORAL_SOLUTION | Freq: Four times a day (QID) | ORAL | Status: DC | PRN

## 2015-11-26 MED ORDER — ONDANSETRON HCL 4 MG/2ML IJ SOLN
4.0000 mg | Freq: Four times a day (QID) | INTRAMUSCULAR | Status: DC | PRN
  Filled 2015-11-26: qty 2

## 2015-11-26 MED ORDER — SODIUM CHLORIDE 0.9 % IV SOLN
8.0000 mg | INTRAVENOUS | Status: AC
  Administered 2015-11-26: 8 mg via INTRAVENOUS
  Filled 2015-11-26: qty 4

## 2015-11-26 MED ORDER — SODIUM CHLORIDE 0.9 % IV SOLN: 250.0000 mL | INTRAVENOUS | Status: DC | PRN

## 2015-11-26 MED ORDER — HYDROMORPHONE 1 MG/ML IV SOLN
INTRAVENOUS | Status: DC
  Administered 2015-11-26: 3.6 mg via INTRAVENOUS
  Administered 2015-11-26 – 2015-11-27 (×2): 3 mg via INTRAVENOUS
  Administered 2015-11-27: 2.4 mg via INTRAVENOUS

## 2015-11-26 MED ORDER — MIDAZOLAM HCL 2 MG/2ML IJ SOLN
INTRAMUSCULAR | Status: DC | PRN
  Administered 2015-11-26 (×3): 1 mg via INTRAVENOUS

## 2015-11-26 MED ORDER — HYDROMORPHONE HCL 2 MG/ML IJ SOLN
INTRAMUSCULAR | Status: AC
  Administered 2015-11-26: 1 mg
  Filled 2015-11-26: qty 1

## 2015-11-26 MED ORDER — NALOXONE HCL 0.4 MG/ML IJ SOLN: 0.4000 mg | INTRAMUSCULAR | Status: DC | PRN

## 2015-11-26 MED ORDER — HYDROMORPHONE 1 MG/ML IV SOLN: INTRAVENOUS | Status: DC

## 2015-11-26 MED ORDER — FENTANYL CITRATE (PF) 100 MCG/2ML IJ SOLN
INTRAMUSCULAR | Status: DC | PRN
  Administered 2015-11-26: 50 ug via INTRAVENOUS
  Administered 2015-11-26: 25 ug via INTRAVENOUS
  Administered 2015-11-26: 50 ug via INTRAVENOUS

## 2015-11-26 MED ORDER — FENTANYL CITRATE (PF) 100 MCG/2ML IJ SOLN
INTRAMUSCULAR | Status: AC
  Filled 2015-11-26: qty 6

## 2015-11-26 MED ORDER — HYDROMORPHONE 1 MG/ML IV SOLN
INTRAVENOUS | Status: AC
  Administered 2015-11-26: 12:00:00
  Filled 2015-11-26: qty 25

## 2015-11-26 MED ORDER — SODIUM CHLORIDE 0.9% FLUSH: 3.0000 mL | Freq: Two times a day (BID) | INTRAVENOUS | Status: DC

## 2015-11-26 MED ORDER — DOCUSATE SODIUM 100 MG PO CAPS
100.0000 mg | ORAL_CAPSULE | Freq: Two times a day (BID) | ORAL | Status: DC
  Administered 2015-11-26: 100 mg via ORAL
  Filled 2015-11-26 (×4): qty 1

## 2015-11-26 MED ORDER — MIDAZOLAM HCL 2 MG/2ML IJ SOLN
INTRAMUSCULAR | Status: AC
  Filled 2015-11-26: qty 6

## 2015-11-26 MED ORDER — LIDOCAINE HCL 1 % IJ SOLN
INTRAMUSCULAR | Status: AC | PRN
  Administered 2015-11-26: 10 mL

## 2015-11-26 MED ORDER — SODIUM CHLORIDE 0.9% FLUSH: 9.0000 mL | INTRAVENOUS | Status: DC | PRN

## 2015-11-26 MED ORDER — IOPAMIDOL (ISOVUE-300) INJECTION 61%
80.0000 mL | Freq: Once | INTRAVENOUS | Status: AC | PRN
  Administered 2015-11-26: 80 mL via INTRA_ARTERIAL

## 2015-11-26 MED ORDER — KETOROLAC TROMETHAMINE 30 MG/ML IJ SOLN
30.0000 mg | Freq: Once | INTRAMUSCULAR | Status: AC
  Administered 2015-11-26: 30 mg via INTRAVENOUS
  Filled 2015-11-26: qty 1

## 2015-11-26 MED ORDER — HYDROMORPHONE HCL 1 MG/ML IJ SOLN
INTRAMUSCULAR | Status: DC | PRN
  Administered 2015-11-26: 1 mg via INTRAVENOUS

## 2015-11-26 MED ORDER — SODIUM CHLORIDE 0.9% FLUSH: 3.0000 mL | INTRAVENOUS | Status: DC | PRN

## 2015-11-26 MED ORDER — KETOROLAC TROMETHAMINE 30 MG/ML IJ SOLN
30.0000 mg | Freq: Four times a day (QID) | INTRAMUSCULAR | Status: DC
  Administered 2015-11-26 – 2015-11-27 (×3): 30 mg via INTRAVENOUS
  Filled 2015-11-26 (×3): qty 1

## 2015-11-26 MED ORDER — SODIUM CHLORIDE 0.9 % IV SOLN
INTRAVENOUS | Status: DC
  Administered 2015-11-26: 08:00:00 via INTRAVENOUS

## 2015-11-26 MED ORDER — PREDNISONE 20 MG PO TABS
20.0000 mg | ORAL_TABLET | Freq: Every day | ORAL | Status: AC
  Administered 2015-11-27: 20 mg via ORAL
  Filled 2015-11-26 (×2): qty 1

## 2015-11-26 NOTE — Procedures (Signed)
Interventional Radiology Procedure Note  Procedure:  Uterine fibroid embolization  Complications:  None  Estimated Blood Loss: < 10 mL  Findings:  Bilateral enlarged uterine arteries supplying fibroids.  Left uterine artery embolized with 2 vials of 500-700 micron Embospheres. Right uterine artery embolized with 1.5 vials of 500-700 micron Embospheres.  Plan:  Overnight observation.  Venetia Night. Kathlene Cote, M.D Pager:  [phone]

## 2015-11-26 NOTE — Progress Notes (Signed)
Patient ID: Alicia Fleming, female   DOB: 1965-04-03, 50 y.o.   MRN: [id number] Patient drowsy, but arousable. Complaining of intermittent pelvic cramping , mild nausea, no vomiting; has had few sips liquids Vital signs stable; afebrile Puncture site right common femoral artery soft, nontender, no hematoma; intact distal pulses. Abdomen soft, positive bowel sounds, mild pelvic tenderness to palpation, fibroid uterus; Foley with yellow urine Status post successful bilateral uterine artery embolization earlier today; Dilaudid PCA for pain; anti-medics as needed; for overnight obs; follow-up with Dr. Kathlene Cote in [place] clinic in 3-4 weeks.

## 2015-11-26 NOTE — Progress Notes (Signed)
Patient ID: Alicia Fleming, female   DOB: 01/22/1966, 50 y.o.   MRN: [id number]    Referring Physician(s): Cousins,S  Supervising Physician: Aletta Edouard  Patient Status:  Outpatient TBA  Chief Complaint:  Symptomatic uterine fibroids  Subjective: Patient familiar to IR service from prior consultation with Dr. Anselm Pancoast on 10/21/15 to discuss treatment options for symptomatic uterine fibroids. She was deemed an appropriate candidate for bilateral uterine artery embolization and presents today for the procedure. She currently denies fever, headache, chest pain, dyspnea, cough, abdominal pain, back pain, nausea, vomiting or abnormal bleeding.  Past Medical History:  Diagnosis Date  . Allergy   . Asthma   . AVNRT (AV nodal re-entry tachycardia) ([place])    a. s/p ablation 2014 Dr Lovena Le  . Fibroid   . Rheumatoid arthritis(714.0)    Past Surgical History:  Procedure Laterality Date  . SMALL INTESTINE SURGERY    . SUPRAVENTRICULAR TACHYCARDIA ABLATION N/A 06/30/2012   AVNRT ablation by Dr Lovena Le     Allergies: Review of patient's allergies indicates no known allergies.  Medications: Prior to Admission medications   Medication Sig Start Date End Date Taking? Authorizing Provider  aspirin-acetaminophen-caffeine (EXCEDRIN MIGRAINE) [phone] MG tablet Take 1 tablet by mouth every 6 (six) hours as needed for headache.    Historical Provider, MD  cetirizine (ZYRTEC) 10 MG tablet Take 10 mg by mouth daily as needed for allergies.    Historical Provider, MD     Vital Signs:Blood pressure 95/82, heart rate 88, temp 99.1, respirations 18, O2 sat 100% room air  LMP 11/12/2015   Physical Exam patient awake, alert. Chest clear to auscultation bilaterally. Heart with regular rate and rhythm. Abdomen soft, fibroid uterus, positive bowel sounds, nontender. Lower extremities with no edema, intact distal pulses.  Imaging: No results found.  Labs:  CBC:  Recent Labs  01/07/15 1005  01/08/15 1549 01/09/15 0425 11/26/15 0800  WBC 4.8 3.3* 4.6 3.5*  HGB 9.8* 9.5* 8.1* 8.8*  HCT 30.0* 28.6* 25.8* 28.7*  PLT 152 117* 116* 266    COAGS:  Recent Labs  11/26/15 0800  INR 0.97  APTT 31    BMP:  Recent Labs  01/07/15 1005 01/08/15 1340 01/09/15 0425 01/10/15 0345  NA 132* 136 135 134*  K 3.7 3.1* 3.1* 4.0  CL 102 106 107 109  CO2 21* 19* 21* 18*  GLUCOSE 136* 111* 92 86  BUN <5* <5* 7 9  CALCIUM 9.3 9.4 8.4* 8.4*  CREATININE 0.59 0.75 0.67 0.57  GFRNONAA >60 >60 >60 >60  GFRAA >60 >60 >60 >60    LIVER FUNCTION TESTS:  Recent Labs  01/07/15 1005 01/08/15 1340  BILITOT 0.5 0.3  AST 18 19  ALT 10* 9*  ALKPHOS 61 60  PROT 7.7 7.9  ALBUMIN 3.5 3.5    Assessment and Plan: Patient with history of symptomatic uterine fibroids and previous consultation with Dr. Anselm Pancoast on 10/21/15 to discuss treatment options. She was deemed an appropriate candidate for bilateral uterine artery embolization and presents today for the procedure. Details/risks of procedure, including but not limited to, internal bleeding, infection, contrast nephropathy, nontarget embolization discussed with patient with her understanding and consent. Post procedure she will be admitted for overnight observation for pain control.   Electronically Signed: D. Rowe Robert 11/26/2015, 8:26 AM   I spent a total of 30 minutes at the the patient's bedside AND on the patient's hospital floor or unit, greater than 50% of which was counseling/coordinating care for bilateral uterine  artery embolization

## 2015-11-27 ENCOUNTER — Other Ambulatory Visit: Payer: Self-pay | Admitting: Radiology

## 2015-11-27 DIAGNOSIS — D259 Leiomyoma of uterus, unspecified: Secondary | ICD-10-CM | POA: Diagnosis not present

## 2015-11-27 LAB — CBC
HCT: 27.3 % — ABNORMAL LOW (ref 36.0–46.0)
HEMOGLOBIN: 8.3 g/dL — AB (ref 12.0–15.0)
MCH: 20.6 pg — AB (ref 26.0–34.0)
MCHC: 30.4 g/dL (ref 30.0–36.0)
MCV: 67.7 fL — AB (ref 78.0–100.0)
PLATELETS: 271 10*3/uL (ref 150–400)
RBC: 4.03 MIL/uL (ref 3.87–5.11)
RDW: 17.9 % — ABNORMAL HIGH (ref 11.5–15.5)
WBC: 5 10*3/uL (ref 4.0–10.5)

## 2015-11-27 LAB — BASIC METABOLIC PANEL
ANION GAP: 6 (ref 5–15)
BUN: 9 mg/dL (ref 6–20)
CHLORIDE: 105 mmol/L (ref 101–111)
CO2: 23 mmol/L (ref 22–32)
Calcium: 9.1 mg/dL (ref 8.9–10.3)
Creatinine, Ser: 0.62 mg/dL (ref 0.44–1.00)
GFR calc Af Amer: >60 mL/min (ref >60)
Glucose, Bld: 129 mg/dL — ABNORMAL HIGH (ref 65–99)
POTASSIUM: 4.4 mmol/L (ref 3.5–5.1)
SODIUM: 134 mmol/L — AB (ref 135–145)

## 2015-11-27 NOTE — Discharge Summary (Signed)
Patient ID: Alicia Fleming MRN: [id number] DOB/AGE: 10/18/1965 50 y.o.  Admit date: 11/26/2015 Discharge date: 11/27/2015  Supervising Physician: Aletta Edouard  Admission Diagnoses: Symptomatic uterine fibroids  Discharge Diagnoses: Symptomatic uterine fibroids, status post successful bilateral uterine artery embolization on 11/26/15 Active Problems:   Fibroids  Past Medical History:  Diagnosis Date  . Allergy   . Asthma   . AVNRT (AV nodal re-entry tachycardia) ([place])    a. s/p ablation 2014 Dr Lovena Le  . Fibroid   . Rheumatoid arthritis(714.0)    Past Surgical History:  Procedure Laterality Date  . IR GENERIC HISTORICAL  11/26/2015   IR ANGIOGRAM SELECTIVE EACH ADDITIONAL VESSEL 11/26/2015 Aletta Edouard, MD [facility]  . IR GENERIC HISTORICAL  11/26/2015   IR ANGIOGRAM PELVIS SELECTIVE OR SUPRASELECTIVE 11/26/2015 Aletta Edouard, MD [facility]  . IR GENERIC HISTORICAL  11/26/2015   IR EMBO TUMOR ORGAN ISCHEMIA INFARCT INC GUIDE ROADMAPPING 11/26/2015 Aletta Edouard, MD [facility]  . IR GENERIC HISTORICAL  11/26/2015   IR ANGIOGRAM PELVIS SELECTIVE OR SUPRASELECTIVE 11/26/2015 Aletta Edouard, MD [facility]  . IR GENERIC HISTORICAL  11/26/2015   IR US GUIDE VASC ACCESS RIGHT 11/26/2015 Aletta Edouard, MD [facility]  . IR GENERIC HISTORICAL  11/26/2015   IR ANGIOGRAM SELECTIVE EACH ADDITIONAL VESSEL 11/26/2015 Aletta Edouard, MD [facility]  . SMALL INTESTINE SURGERY    . SUPRAVENTRICULAR TACHYCARDIA ABLATION N/A 06/30/2012   AVNRT ablation by Dr Lovena Le      Discharged Condition: good  Hospital Course: Alicia Fleming is a 50 year old female who was referred to the IR service on 10/21/15 to discuss treatment options for symptomatic uterine fibroids.She was deemed an appropriate candidate for bilateral uterine artery embolization. On 11/26/15 she underwent successful bilateral uterine artery embolization by Dr. Kathlene Cote. The procedure was performed without immediate  complications and she was subsequently admitted for overnight observation for pain control. Postprocedure she did experience expected intermittent pelvic cramping along with some occasional nausea. She was placed on a Dilaudid PCA pump and given antiemetics as needed. On the day of discharge she was stable and   able to void, ambulate and tolerate her diet without significant difficulty. She did continue to have some intermittent pelvic cramping as well as some mild nausea. She was told this could persist but gradually improve over time. She was given prescriptions for Norco 5/325, #30, no refills, 1-2 tablets every 4-6 hours as needed for moderate to severe pain, Zofran 8 mg, #20, no refills, 1 tablet twice daily as needed for nausea,  ibuprofen 600 mg, #20, no refills, 1 tablet every 6 hours for the next 5 days, Colace 100 mg, #20, no refills, 1 tablet twice daily as needed for constipation. She will be scheduled for follow-up in the IR clinic with Dr. Kathlene Cote in 1 month. She was told to contact our service with any additional questions or concerns. She will continue current gynecologic care with Dr. Garwin Brothers.  Consults: none  Significant Diagnostic Studies:  Results for orders placed or performed during the hospital encounter of 11/26/15  APTT  Result Value Ref Range   aPTT 31 24 - 36 seconds  Basic metabolic panel  Result Value Ref Range   Sodium 136 135 - 145 mmol/L   Potassium 4.1 3.5 - 5.1 mmol/L   Chloride 107 101 - 111 mmol/L   CO2 23 22 - 32 mmol/L   Glucose, Bld 98 65 - 99 mg/dL   BUN 8 6 - 20 mg/dL  Creatinine, Ser 0.63 0.44 - 1.00 mg/dL   Calcium 9.3 8.9 - 10.3 mg/dL   GFR calc non Af Amer >60 >60 mL/min   GFR calc Af Amer >60 >60 mL/min   Anion gap 6 5 - 15  CBC  Result Value Ref Range   WBC 3.5 (L) 4.0 - 10.5 K/uL   RBC 4.25 3.87 - 5.11 MIL/uL   Hemoglobin 8.8 (L) 12.0 - 15.0 g/dL   HCT 28.7 (L) 36.0 - 46.0 %   MCV 67.5 (L) 78.0 - 100.0 fL   MCH 20.7 (L) 26.0 - 34.0 pg    MCHC 30.7 30.0 - 36.0 g/dL   RDW 18.0 (H) 11.5 - 15.5 %   Platelets 266 150 - 400 K/uL  Protime-INR  Result Value Ref Range   Prothrombin Time 12.9 11.4 - 15.2 seconds   INR 0.97   hCG, serum, qualitative  Result Value Ref Range   Preg, Serum NEGATIVE NEGATIVE  CBC  Result Value Ref Range   WBC 5.0 4.0 - 10.5 K/uL   RBC 4.03 3.87 - 5.11 MIL/uL   Hemoglobin 8.3 (L) 12.0 - 15.0 g/dL   HCT 27.3 (L) 36.0 - 46.0 %   MCV 67.7 (L) 78.0 - 100.0 fL   MCH 20.6 (L) 26.0 - 34.0 pg   MCHC 30.4 30.0 - 36.0 g/dL   RDW 17.9 (H) 11.5 - 15.5 %   Platelets 271 150 - 400 K/uL  Basic metabolic panel  Result Value Ref Range   Sodium 134 (L) 135 - 145 mmol/L   Potassium 4.4 3.5 - 5.1 mmol/L   Chloride 105 101 - 111 mmol/L   CO2 23 22 - 32 mmol/L   Glucose, Bld 129 (H) 65 - 99 mg/dL   BUN 9 6 - 20 mg/dL   Creatinine, Ser 0.62 0.44 - 1.00 mg/dL   Calcium 9.1 8.9 - 10.3 mg/dL   GFR calc non Af Amer >60 >60 mL/min   GFR calc Af Amer >60 >60 mL/min   Anion gap 6 5 - 15     Treatments: Successful bilateral uterine artery embolization via IV conscious sedation on 11/26/15  Discharge Exam: Blood pressure 105/66, pulse 79, temperature 98.4 F (36.9 C), temperature source Oral, resp. rate 16, last menstrual period 11/12/2015, SpO2 97 %. Patient awake, alert. Chest clear to auscultation bilaterally. Heart with regular rate and rhythm. Abdomen soft, positive bowel sounds, minimal pelvic tenderness to palpation, fibroid uterus. Puncture site right common femoral artery soft, mildly tender, no distinct hematoma; lower extremities -no significant edema and intact distal pulses.  Disposition: home  Discharge Instructions    Call MD for:  difficulty breathing, headache or visual disturbances    Complete by:  As directed   Call MD for:  extreme fatigue    Complete by:  As directed   Call MD for:  hives    Complete by:  As directed   Call MD for:  persistant dizziness or light-headedness    Complete by:  As  directed   Call MD for:  persistant nausea and vomiting    Complete by:  As directed   Call MD for:  redness, tenderness, or signs of infection (pain, swelling, redness, odor or green/yellow discharge around incision site)    Complete by:  As directed   Call MD for:  severe uncontrolled pain    Complete by:  As directed   Call MD for:  temperature >100.4    Complete by:  As directed  Change dressing (specify)    Complete by:  As directed   May change bandage over right groin and place new Band-Aid to site for the next 2-3 days. May wash site with soap and water.   Diet - low sodium heart healthy    Complete by:  As directed   Discharge instructions    Complete by:  As directed   Stay well hydrated; radiology will call you with follow up appointment with Dr. Kathlene Cote in 3-4 weeks.   Driving Restrictions    Complete by:  As directed   No driving for the next 48 hours   Increase activity slowly    Complete by:  As directed   Lifting restrictions    Complete by:  As directed   No heavy lifting for the next 3-4 days   May shower / Bathe    Complete by:  As directed   May walk up steps    Complete by:  As directed   Sexual Activity Restrictions    Complete by:  As directed   No sexual intercourse for 1 week       Medication List    You have not been prescribed any medications.    Follow-up Information    Azzie Roup, MD .   Specialty:  Interventional Radiology Why:  Radiology will call you with follow up appointment with Dr. Kathlene Cote in the IR clinic in 3-4 weeks.; call [phone] or [phone] with any questions or concerns. Contact information: [address] [place] [address] [phone]        Marvene Staff, MD .   Specialty:  Obstetrics and Gynecology Why:  Continue current gynecologic care with Dr. Garwin Brothers as scheduled Contact information: [address] [place] [address] [phone]            Electronically Signed: D.  Rowe Robert 11/27/2015, 11:12 AM   I have spent less than 30 minutes discharging Alicia Fleming.

## 2015-11-27 NOTE — Progress Notes (Signed)
Discharge instructions reviewed with patient and sister, questions answered, verbalized understanding.  Patient transported to front of hospital via wheelchair accompanied by RN to be taken home by significant other.  Prescriptions and discharge instructions given to patient prior to discharge.

## 2015-11-27 NOTE — Discharge Instructions (Signed)
Uterine Artery Embolization for Fibroids, Care After °Refer to this sheet in the next few weeks. These instructions provide you with information on caring for yourself after your procedure. Your health care provider may also give you more specific instructions. Your treatment has been planned according to current medical practices, but problems sometimes occur. Call your health care provider if you have any problems or questions after your procedure. °WHAT TO EXPECT AFTER THE PROCEDURE °After your procedure, it is typical to have cramping in the pelvis. You will be given pain medicine to control it. °HOME CARE INSTRUCTIONS °· Only take over-the-counter or prescription medicines for pain, discomfort, or fever as directed by your health care provider. °· Do not take aspirin. It can cause bleeding. °· Follow your health care provider's advice regarding medicines given to you, diet, activity, and when to begin sexual activity. °· See your health care provider for follow-up care as directed. °SEEK MEDICAL CARE IF: °· You have a fever. °· You have redness, swelling, and pain around your incision site. °· You have pus draining from your incision. °· You have a rash. °SEEK IMMEDIATE MEDICAL CARE IF: °· You have bleeding from your incision site. °· You have difficulty breathing. °· You have chest pain. °· You have severe abdominal pain. °· You have leg pain. °· You become dizzy and faint. °  °This information is not intended to replace advice given to you by your health care provider. Make sure you discuss any questions you have with your health care provider. °  °Document Released: 01/03/2013 Document Reviewed: 01/03/2013 °Elsevier Interactive Patient Education ©2016 [employer] ° °

## 2015-12-26 ENCOUNTER — Encounter: Payer: Self-pay | Admitting: Diagnostic Radiology

## 2016-05-06 ENCOUNTER — Other Ambulatory Visit (HOSPITAL_COMMUNITY): Payer: Self-pay | Admitting: Interventional Radiology

## 2016-05-06 DIAGNOSIS — D259 Leiomyoma of uterus, unspecified: Secondary | ICD-10-CM

## 2016-05-13 ENCOUNTER — Other Ambulatory Visit: Payer: Self-pay | Admitting: Obstetrics and Gynecology

## 2016-05-13 DIAGNOSIS — D259 Leiomyoma of uterus, unspecified: Secondary | ICD-10-CM

## 2016-06-03 ENCOUNTER — Ambulatory Visit
Admission: RE | Admit: 2016-06-03 | Discharge: 2016-06-03 | Disposition: A | Discharge status: Home / Self Care | Payer: 59 | Source: Ambulatory Visit | Attending: Obstetrics and Gynecology | Admitting: Obstetrics and Gynecology

## 2016-06-03 ENCOUNTER — Ambulatory Visit
Admission: RE | Admit: 2016-06-03 | Discharge: 2016-06-03 | Disposition: A | Discharge status: Home / Self Care | Payer: 59 | Source: Ambulatory Visit | Attending: Radiology | Admitting: Radiology

## 2016-06-03 DIAGNOSIS — D259 Leiomyoma of uterus, unspecified: Secondary | ICD-10-CM

## 2016-06-03 MED ORDER — GADOBENATE DIMEGLUMINE 529 MG/ML IV SOLN: 13.0000 mL | Freq: Once | INTRAVENOUS | Status: DC | PRN

## 2016-06-03 NOTE — Progress Notes (Signed)
Entered in error. Physical Exam  Brynda Greathouse, MS RD PA-C

## 2016-10-30 IMAGING — MR MR HEAD W/O CM
10 of 12 series · 34 of 48 positions shown · non-contrast
Comparison: CT head without contrast from the same day.

CLINICAL DATA: Seizure. Episode of loss of consciousness with
seizure-like activity. Personal history of supraventricular
tachycardia.

EXAM:
MRI HEAD WITHOUT CONTRAST
TECHNIQUE: Multiplanar, multiecho pulse sequences of the brain and surrounding
structures were obtained without intravenous contrast.

[Series 2: FLAIR · sagittal · 5.0mm · 0.47mm/px · 3 of 23 slices shown (1 of 2)]
[im 1/23]
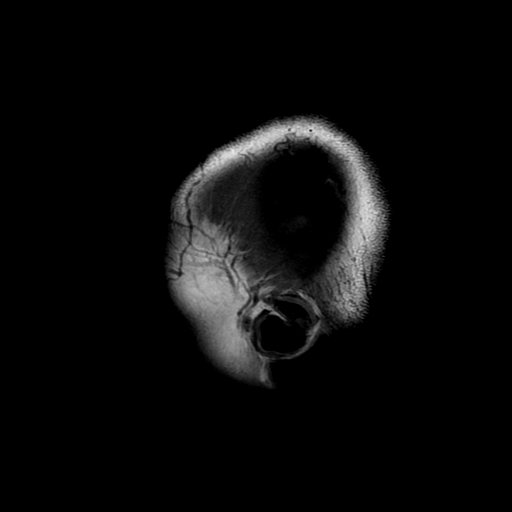
[im 12/23]
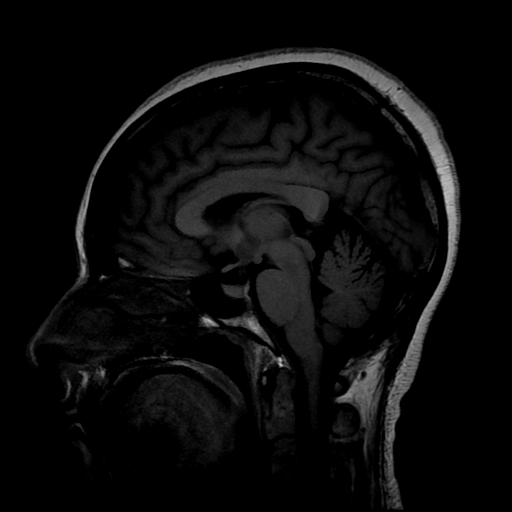
[im 23/23]
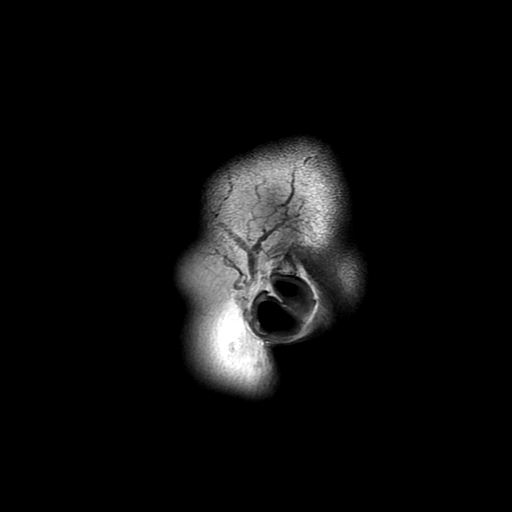

[Series 4: DWI · axial · 3.6mm · 0.94mm/px · z∈[-93,+40]mm · 8 of 80 slices shown (1 of 4)]
[im 1/80]
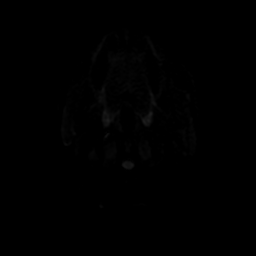
[im 12/80]
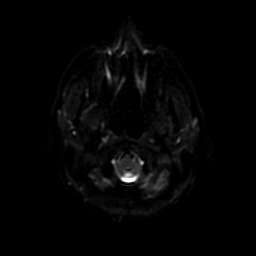
[im 23/80]
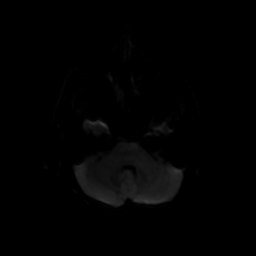
[im 34/80]
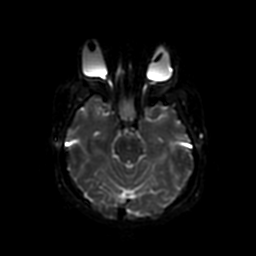
[im 46/80]
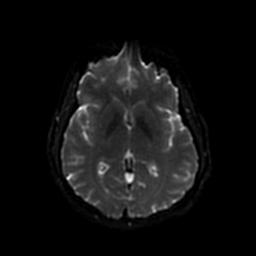
[im 57/80]
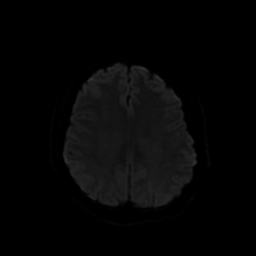
[im 68/80]
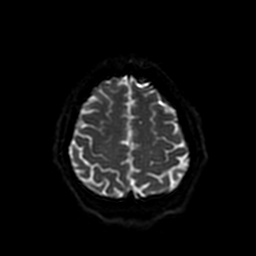
[im 80/80]
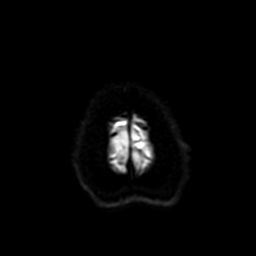

[Series 6: T2 · axial · 5.0mm · 0.47mm/px · z∈[-91,+39]mm · 2 of 24 slices shown (1 of 2)]
[im 1/24]
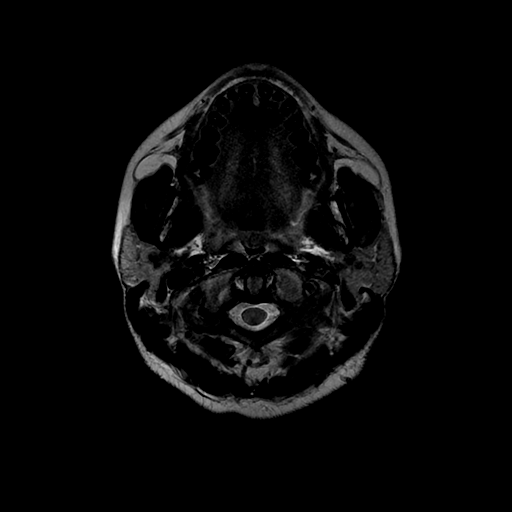
[im 24/24]
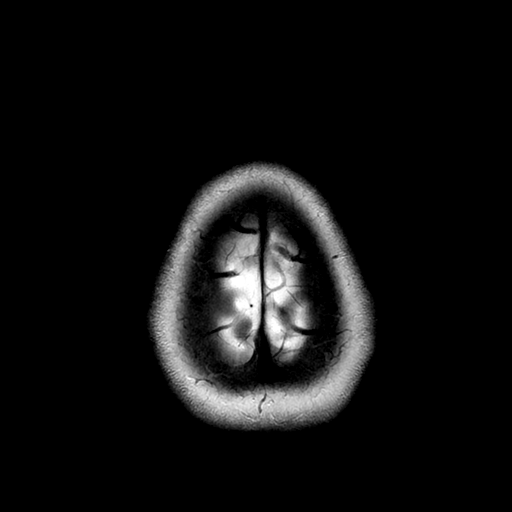

[Series 7: FLAIR · axial · 5.0mm · 0.47mm/px · z∈[-91,+39]mm · 2 of 24 slices shown (2 of 2)]
[im 1/24]
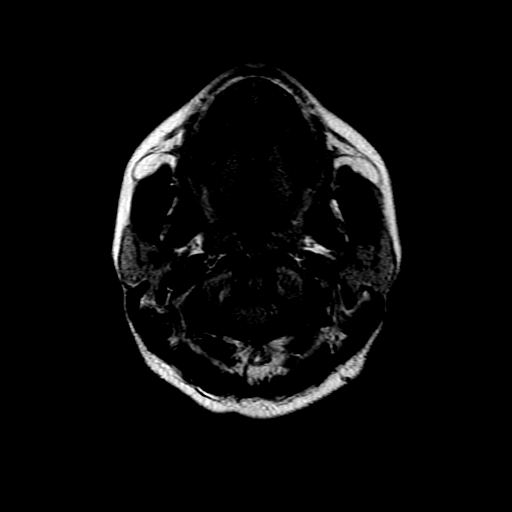
[im 24/24]
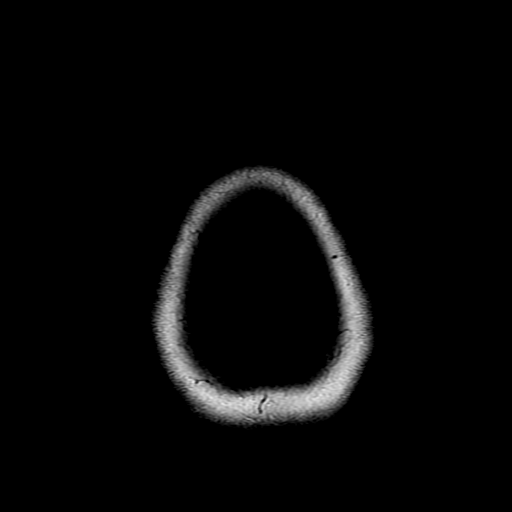

[Series 10: (person_name) · axial · 3.0mm · 0.47mm/px · z∈[-96,-80]mm · 2 of 96 slices shown]
[im 1/96]
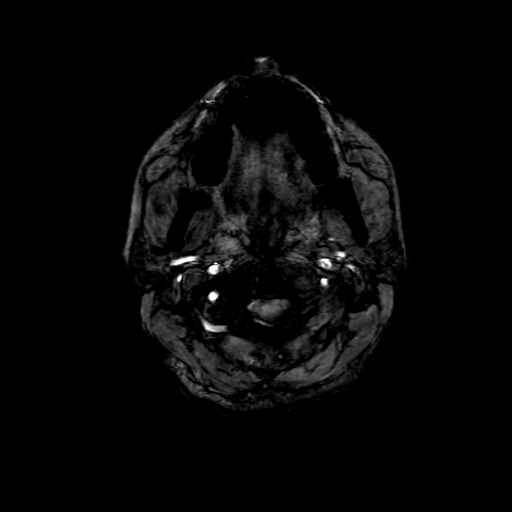
[im 12/96]
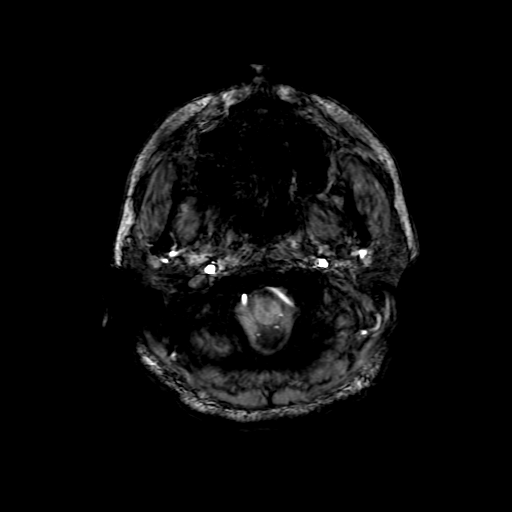

[Series 11: DWI · coronal · 5.0mm · 0.94mm/px · 6 of 64 slices shown (2 of 4)]
[im 1/64]
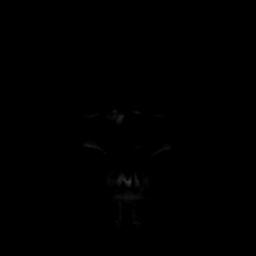
[im 13/64]
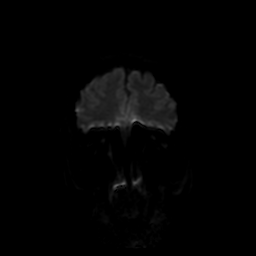
[im 26/64]
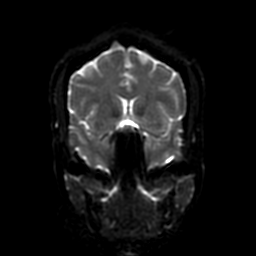
[im 38/64]
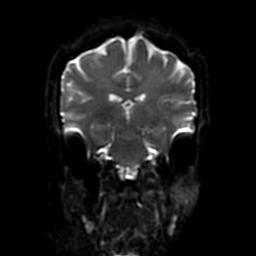
[im 51/64]
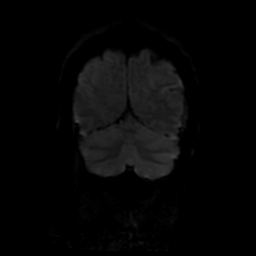
[im 64/64]
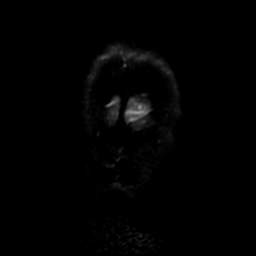

[Series 15: T2 · coronal · 5.0mm · 0.47mm/px · 2 of 25 slices shown (2 of 2)]
[im 1/25]
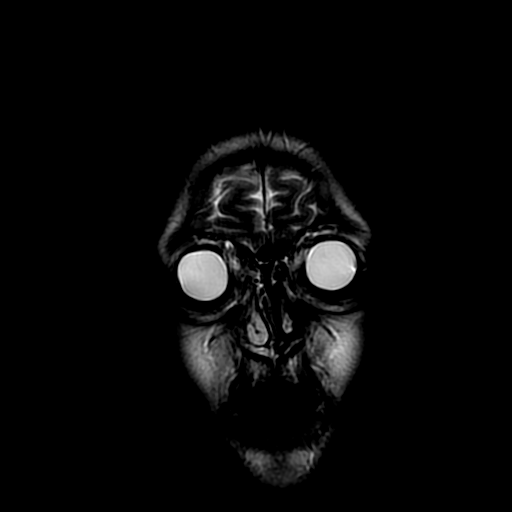
[im 25/25]
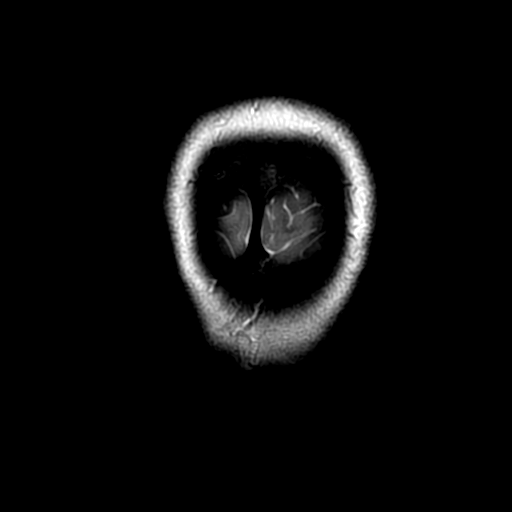

[Series 16: T2 fat-sat · coronal · 3.0mm · 0.43mm/px · 2 of 25 slices shown]
[im 1/25]
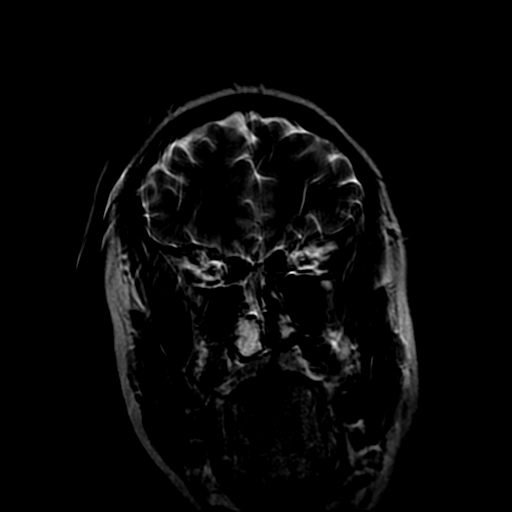
[im 25/25]
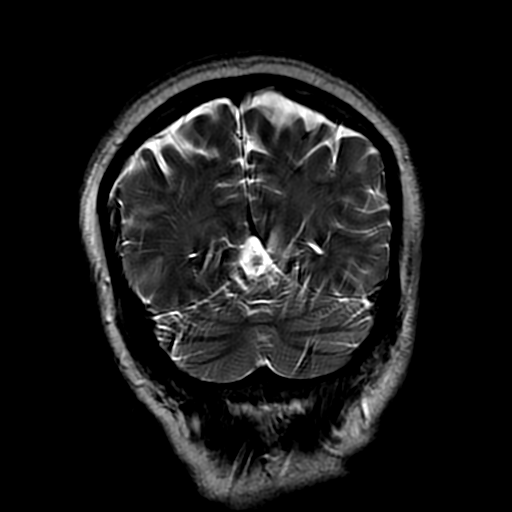

[Series 400: DWI · axial · 3.6mm · 0.94mm/px · z∈[-93,+40]mm · 4 of 40 slices shown (3 of 4)]
[im 1/40]
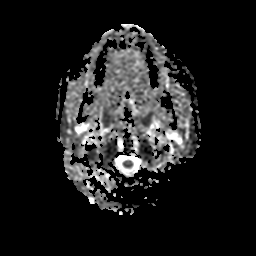
[im 14/40]
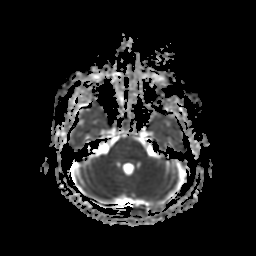
[im 27/40]
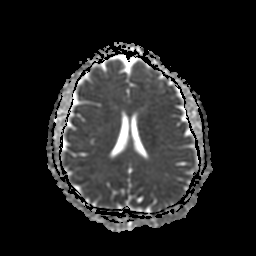
[im 40/40]
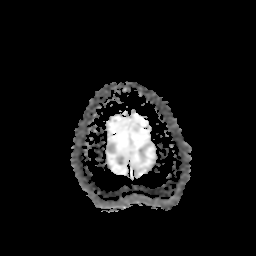

[Series 1100: DWI · coronal · 5.0mm · 0.94mm/px · 3 of 31 slices shown (4 of 4)]
[im 1/31]
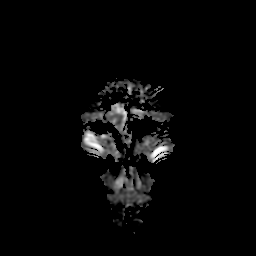
[im 16/31]
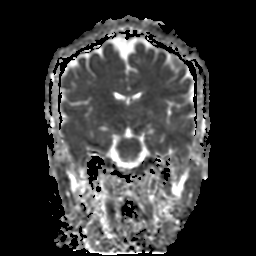
[im 31/31]
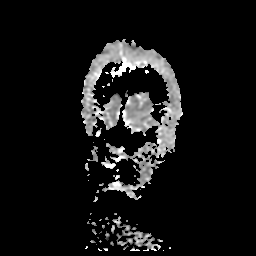

[34 of 48 positions shown; findings below may reference images not displayed]

FINDINGS: No acute infarct, hemorrhage, or mass lesion is present. The
ventricles are of normal size. No significant extraaxial fluid
collection is present.

No significant white matter disease is present. The internal
auditory canals are within normal limits. The brainstem is normal.
Flow is present in the major intracranial arteries. Globes and
orbits are intact.

Mucosal thickening and fluid is present in the sphenoid sinuses
bilaterally. Minimal mucosal thickening is present in the anterior
ethmoid air cells. The remaining paranasal sinuses are clear.

Dedicated imaging of the temporal lobes demonstrate symmetric size
and signal of the hippocampal structures bilaterally.
IMPRESSION: 1. Normal MRI appearance of the brain. No acute or focal lesion to
explain seizure.
2. Bilateral sphenoid sinus disease.
